# Patient Record
Sex: Female | Born: 1935 | Race: White | Hispanic: No | Marital: Single | Smoking: Never smoker
Health system: Southern US, Community
[De-identification: ages and names within clinical notes are randomized; demographics above are authoritative.]

## PROBLEM LIST (undated history)

## (undated) DIAGNOSIS — E119 Type 2 diabetes mellitus without complications: Secondary | ICD-10-CM

## (undated) DIAGNOSIS — I10 Essential (primary) hypertension: Secondary | ICD-10-CM

## (undated) DIAGNOSIS — E669 Obesity, unspecified: Secondary | ICD-10-CM

## (undated) DIAGNOSIS — I2510 Atherosclerotic heart disease of native coronary artery without angina pectoris: Secondary | ICD-10-CM

## (undated) HISTORY — DX: Obesity, unspecified: E66.9

## (undated) HISTORY — PX: CHOLECYSTECTOMY: SHX55

## (undated) HISTORY — DX: Atherosclerotic heart disease of native coronary artery without angina pectoris: I25.10

## (undated) HISTORY — DX: Type 2 diabetes mellitus without complications: E11.9

## (undated) HISTORY — DX: Essential (primary) hypertension: I10

---

## 2002-06-05 ENCOUNTER — Encounter: Payer: Self-pay | Admitting: Emergency Medicine

## 2002-06-05 ENCOUNTER — Emergency Department (HOSPITAL_COMMUNITY): Admission: EM | Admit: 2002-06-05 | Discharge: 2002-06-05 | Payer: Self-pay | Admitting: Emergency Medicine

## 2005-01-18 ENCOUNTER — Ambulatory Visit: Payer: Self-pay | Admitting: Orthopedic Surgery

## 2009-12-23 ENCOUNTER — Ambulatory Visit: Payer: Self-pay | Admitting: Cardiovascular Disease

## 2009-12-23 ENCOUNTER — Encounter: Payer: Self-pay | Admitting: Cardiology

## 2009-12-23 ENCOUNTER — Observation Stay (HOSPITAL_COMMUNITY): Admission: RE | Admit: 2009-12-23 | Discharge: 2009-12-25 | Payer: Self-pay | Admitting: Cardiovascular Disease

## 2009-12-24 ENCOUNTER — Encounter (INDEPENDENT_AMBULATORY_CARE_PROVIDER_SITE_OTHER): Payer: Self-pay | Admitting: Internal Medicine

## 2009-12-24 ENCOUNTER — Encounter: Payer: Self-pay | Admitting: Cardiology

## 2009-12-24 ENCOUNTER — Encounter: Payer: Self-pay | Admitting: Cardiovascular Disease

## 2009-12-24 ENCOUNTER — Encounter (INDEPENDENT_AMBULATORY_CARE_PROVIDER_SITE_OTHER): Payer: Self-pay | Admitting: Physician Assistant

## 2010-01-01 ENCOUNTER — Encounter: Payer: Self-pay | Admitting: Cardiology

## 2010-01-01 DIAGNOSIS — R002 Palpitations: Secondary | ICD-10-CM

## 2010-01-01 DIAGNOSIS — R072 Precordial pain: Secondary | ICD-10-CM

## 2010-01-06 ENCOUNTER — Ambulatory Visit: Payer: Self-pay | Admitting: Cardiology

## 2010-01-15 ENCOUNTER — Ambulatory Visit: Payer: Self-pay | Admitting: Cardiology

## 2010-01-15 DIAGNOSIS — I10 Essential (primary) hypertension: Secondary | ICD-10-CM | POA: Insufficient documentation

## 2010-01-16 ENCOUNTER — Encounter (INDEPENDENT_AMBULATORY_CARE_PROVIDER_SITE_OTHER): Payer: Self-pay | Admitting: *Deleted

## 2010-03-02 ENCOUNTER — Encounter: Payer: Self-pay | Admitting: Cardiology

## 2010-12-22 NOTE — Letter (Signed)
Summary: Appointment -missed   March 02, 2010 MRN: [id number]      [facility] [address], [place]  [address]     Dear Ms. Knapik,  Our records indicate you missed your appointment on March 02, 2010                        with Dr.  Andee Lineman.   It is very important that we reach you to reschedule this appointment. We look forward to participating in your health care needs.   Please contact us at the number listed above at your earliest convenience to reschedule this appointment.   Sincerely,    Glass blower/designer

## 2010-12-22 NOTE — Letter (Signed)
Summary: Engineer, materials at [facility]  [address]   [place], [place] [address]   Phone: [phone]  Fax: [phone]        January 16, 2010 MRN: [id number]   [facility] [address] [place]  [address]   Dear Ms. Febles,  Your test ordered by Selena Batten has been reviewed by your physician (or physician assistant) and was found to be normal or stable. Your physician (or physician assistant) felt no changes were needed at this time.  ____ Echocardiogram  __X__ Cardiac Stress Test  ____ Lab Work  ____ Peripheral vascular study of arms, legs or neck  ____ CT scan or X-ray  ____ Lung or Breathing test  ____ Other:   Thank you.   Hoover Brunette, LPN    Duane Boston, M.D., F.A.C.C. Thressa Sheller, M.D., F.A.C.C. Oneal Grout, M.D., F.A.C.C. Cheree Ditto, M.D., F.A.C.C. Daiva Nakayama, M.D., F.A.C.C. Kenney Houseman, M.D., F.A.C.C. Jeanne Ivan, PA-C

## 2010-12-22 NOTE — Miscellaneous (Signed)
Summary: Orders Update  Clinical Lists Changes  Problems: Added new problem of PALPITATIONS (ICD-785.1) Added new problem of PRECORDIAL PAIN (ICD-786.51) Orders: Added new Referral order of Nuclear Med (Nuc Med) - Signed

## 2010-12-22 NOTE — Assessment & Plan Note (Signed)
Summary: NPH D/C [facility] 2/2/ CHEST PAINS   Visit Type:  Follow-up Primary Provider:  Sherryll Burger  CC:  new patient hospital follow up.  History of Present Illness: the patient is a 77 year old female with a history of atypical chest pain recently hospitalized and ruled out for myocardial infarction.  The patient has diet-controlled diabetes mellitus and newly diagnosed essential hypertension.  She also has morbid obesity.  The patient was also ruled out for acute pulmonary was him with a CT scan which was negative.  An echocardiogram showed normal LV function.  The patient was referred for an outpatient stress test.the patient's stress test was entirely within normal limits.  She did have a transient left bundle branch block. f on initial presentation the patient reported that she had "weird feeling in the chest off work.nitroglycerin seems to help.  She also felt palpitations.  She said very little problems since she has gone home.  She has occasional discomfort in her chest which occurs in the evening but she is able to go to sleep without any difficulty.  The patient denies any chest pain on exertion orthopnea PND palpitations or syncope.  Her blood pressure however is poorly controlled.  She also appears to have symptoms of reflux.  Preventive Screening-Counseling & Management  Alcohol-Tobacco     Smoking Status: never   Current Medications (verified): 1)  Aspir-Low 81 Mg Tbec (Aspirin) .... Take 1 Tablet By Mouth Once A Day 2)  Amlodipine Besylate 5 Mg Tabs (Amlodipine Besylate) .... Take 1 Tablet By Mouth Once A Day 3)  Metoprolol Tartrate 25 Mg Tabs (Metoprolol Tartrate) .... Take 1/2 Tablet By Mouth Two Times A Day 4)  Nitrostat 0.4 Mg Subl (Nitroglycerin) .... Use As Directed 5)  Multivitamins  Tabs (Multiple Vitamin) .... Take 1 Tablet By Mouth Once A Day 6)  Ascorbic Acid 500 Mg Tabs (Ascorbic Acid) .... Take 1 Tablet By Mouth Once A Day 7)  Omeprazole Magnesium 20.6 (20 Base) Mg Cpdr  (Omeprazole Magnesium) .... Take 1 Tablet By Mouth Once A Day  Allergies: 1)  ! * Tuna Fish  Comments:  Nurse/Medical Assistant: The patient's medications and allergies were reviewed with the patient and were updated in the Medication and Allergy Lists. Bottles reviewed.  Past History:  Past Medical History: Last updated: 01/15/2010 Diabetes.  Family history of coronary artery disease.  Obesity with a body mass index of 35.4.   Past Surgical History: Last updated: 01/15/2010 Cholecystectomy  Family History: Last updated: 01/15/2010 Sgnificant for Coronary disease and Diabetes in her mother  Social History: Last updated: 01/15/2010 Retired  Married  Alcohol Use - no Drug Use - no  Risk Factors: Smoking Status: never (01/15/2010)  Social History: Smoking Status:  never  Review of Systems       The patient complains of palpitations.  The patient denies fatigue, malaise, fever, weight gain/loss, vision loss, decreased hearing, hoarseness, chest pain, shortness of breath, prolonged cough, wheezing, sleep apnea, coughing up blood, abdominal pain, blood in stool, nausea, vomiting, diarrhea, heartburn, incontinence, blood in urine, muscle weakness, joint pain, leg swelling, rash, skin lesions, headache, fainting, dizziness, depression, anxiety, enlarged lymph nodes, easy bruising or bleeding, and environmental allergies.    Vital Signs:  Patient profile:   77 year old female Height:      63 inches Weight:      209 pounds BMI:     37.16 Pulse rate:   59 / minute BP sitting:   151 / 78  (left  arm) Cuff size:   large  Vitals Entered By: Carlye Grippe (January 15, 2010 10:41 AM)  Nutrition Counseling: Patient's BMI is greater than 25 and therefore counseled on weight management options. CC: new patient hospital follow up   Physical Exam  Additional Exam:  General: Well-developed, well-nourished in no distress head: Normocephalic and atraumatic eyes PERRLA/EOMI  intact, conjunctiva and lids normal nose: No deformity or lesions mouth normal dentition, normal posterior pharynx neck: Supple, no JVD.  No masses, thyromegaly or abnormal cervical nodes lungs: Normal breath sounds bilaterally without wheezing.  Normal percussion heart: regular rate and rhythm with normal S1 and S2, no S3 or S4.  PMI is normal.  No pathological murmurs abdomen: Normal bowel sounds, abdomen is soft and nontender without masses, organomegaly or hernias noted.  No hepatosplenomegaly musculoskeletal: Back normal, normal gait muscle strength and tone normal pulsus: Pulse is normal in all 4 extremities Extremities: No peripheral pitting edema neurologic: Alert and oriented x 3 skin: Intact without lesions or rashes cervical nodes: No significant adenopathy psychologic: Normal affect    Impression & Recommendations:  Problem # 1:  PRECORDIAL PAIN (ICD-786.51) the patient reported atypical chest pain.  She stress test done which showed no ischemia.  Will continue to treat her medically.I suspect the patient may have esophageal spasm and therefore we switched her lisinopril to amlodipine and also have given her omeprazole as an antireflux measure. Her updated medication list for this problem includes:    Aspir-low 81 Mg Tbec (Aspirin) .Marland Kitchen... Take 1 tablet by mouth once a day    Amlodipine Besylate 5 Mg Tabs (Amlodipine besylate) .Marland Kitchen... Take 1 tablet by mouth once a day    Metoprolol Tartrate 25 Mg Tabs (Metoprolol tartrate) .Marland Kitchen... Take 1/2 tablet by mouth two times a day    Nitrostat 0.4 Mg Subl (Nitroglycerin) ..... Use as directed  Problem # 2:  PALPITATIONS (ICD-785.1) no clear need for a cardiac monitor at the present time. Her updated medication list for this problem includes:    Aspir-low 81 Mg Tbec (Aspirin) .Marland Kitchen... Take 1 tablet by mouth once a day    Amlodipine Besylate 5 Mg Tabs (Amlodipine besylate) .Marland Kitchen... Take 1 tablet by mouth once a day    Metoprolol Tartrate 25 Mg  Tabs (Metoprolol tartrate) .Marland Kitchen... Take 1/2 tablet by mouth two times a day    Nitrostat 0.4 Mg Subl (Nitroglycerin) ..... Use as directed  Problem # 3:  ESSENTIAL HYPERTENSION, BENIGN (ICD-401.1) the patient is a very low dose of lisinopril.  She has a cough and we will change this to amlodipine at 5 minutes p.o. daily. Her updated medication list for this problem includes:    Aspir-low 81 Mg Tbec (Aspirin) .Marland Kitchen... Take 1 tablet by mouth once a day    Amlodipine Besylate 5 Mg Tabs (Amlodipine besylate) .Marland Kitchen... Take 1 tablet by mouth once a day    Metoprolol Tartrate 25 Mg Tabs (Metoprolol tartrate) .Marland Kitchen... Take 1/2 tablet by mouth two times a day  Patient Instructions: 1)  Stop Lisinopril 2)  Amlodipine 5mg  daily 3)  Omeprazole 20mg  daily 4)  Follow up in  6 weeks. Prescriptions: OMEPRAZOLE MAGNESIUM 20.6 (20 BASE) MG CPDR (OMEPRAZOLE MAGNESIUM) Take 1 tablet by mouth once a day  #30 x 1   Entered by:   Hoover Brunette, LPN   Authorized by:   Lewayne Bunting, MD, [facility]   Signed by:   Hoover Brunette, LPN on 45/40/9811   Method used:   Electronically to  [facility] (retail)       [address]       [place], [place]  [address]       Ph: [phone]       Fax: [phone]   RxID:   [id number] AMLODIPINE BESYLATE 5 MG TABS (AMLODIPINE BESYLATE) Take 1 tablet by mouth once a day  #30 x 6   Entered by:   Hoover Brunette, LPN   Authorized by:   Lewayne Bunting, MD, [facility]   Signed by:   Hoover Brunette, LPN on 84/13/2440   Method used:   Electronically to        [facility] (retail)       [address] [place], [place]  [address]       Ph: [phone]       Fax: [phone]   RxID:   [id number]   Handout requested.

## 2010-12-22 NOTE — Consult Note (Signed)
Summary: [facility]   [facility]   Imported By: Zachary George 01/01/2010 17:28:17  _____________________________________________________________________  External Attachment:    Type:   Image     Comment:   External Document

## 2010-12-25 NOTE — Consult Note (Signed)
[facility]   Imported By: Roderic Ovens 01/12/2010 14:23:14  _____________________________________________________________________  External Attachment:    Type:   Image     Comment:   External Document

## 2011-02-11 LAB — DIFFERENTIAL
Basophils Relative: 0 % (ref 0–1)
Lymphocytes Relative: 21 % (ref 12–46)
Monocytes Relative: 4 % (ref 3–12)
Neutro Abs: 5.6 10*3/uL (ref 1.7–7.7)
Neutrophils Relative %: 74 % (ref 43–77)

## 2011-02-11 LAB — CARDIAC PANEL(CRET KIN+CKTOT+MB+TROPI)
CK, MB: 2.6 ng/mL (ref 0.3–4.0)
Relative Index: INVALID (ref 0.0–2.5)
Total CK: 75 U/L (ref 7–177)
Total CK: 89 U/L (ref 7–177)

## 2011-02-11 LAB — LIPID PANEL
HDL: 49 mg/dL (ref >39)
LDL Cholesterol: 93 mg/dL (ref 0–99)
Total CHOL/HDL Ratio: 3.5 RATIO
VLDL: 29 mg/dL (ref 0–40)

## 2011-02-11 LAB — CK TOTAL AND CKMB (NOT AT ARMC)
CK, MB: 5.3 ng/mL — ABNORMAL HIGH (ref 0.3–4.0)
Relative Index: 4.8 — ABNORMAL HIGH (ref 0.0–2.5)

## 2011-02-11 LAB — BASIC METABOLIC PANEL
CO2: 27 mEq/L (ref 19–32)
Calcium: 9.4 mg/dL (ref 8.4–10.5)
Creatinine, Ser: 0.54 mg/dL (ref 0.4–1.2)
GFR calc Af Amer: >60 mL/min (ref >60)
GFR calc non Af Amer: >60 mL/min (ref >60)

## 2011-02-11 LAB — CBC
MCHC: 33.8 g/dL (ref 30.0–36.0)
RBC: 4.19 MIL/uL (ref 3.87–5.11)
WBC: 7.6 10*3/uL (ref 4.0–10.5)

## 2011-02-11 LAB — HEMOGLOBIN A1C: Hgb A1c MFr Bld: 6.5 % — ABNORMAL HIGH (ref 4.6–6.1)

## 2011-02-11 LAB — GLUCOSE, CAPILLARY
Glucose-Capillary: 101 mg/dL — ABNORMAL HIGH (ref 70–99)
Glucose-Capillary: 136 mg/dL — ABNORMAL HIGH (ref 70–99)

## 2011-02-11 LAB — BRAIN NATRIURETIC PEPTIDE: Pro B Natriuretic peptide (BNP): 79 pg/mL (ref 0.0–100.0)

## 2012-05-23 ENCOUNTER — Encounter: Payer: Self-pay | Admitting: Cardiology

## 2012-11-22 HISTORY — PX: CARDIAC CATHETERIZATION: SHX172

## 2015-10-06 ENCOUNTER — Other Ambulatory Visit (HOSPITAL_COMMUNITY): Payer: Self-pay | Admitting: Endocrinology

## 2015-10-06 DIAGNOSIS — E0590 Thyrotoxicosis, unspecified without thyrotoxic crisis or storm: Secondary | ICD-10-CM

## 2015-10-06 DIAGNOSIS — E0511 Thyrotoxicosis with toxic single thyroid nodule with thyrotoxic crisis or storm: Secondary | ICD-10-CM

## 2015-10-08 ENCOUNTER — Encounter (HOSPITAL_COMMUNITY): Payer: Medicare Other

## 2015-10-08 ENCOUNTER — Encounter (HOSPITAL_COMMUNITY)
Admission: RE | Admit: 2015-10-08 | Discharge: 2015-10-08 | Disposition: A | Discharge status: Home / Self Care | Payer: Medicare Other | Source: Ambulatory Visit | Attending: Endocrinology | Admitting: Endocrinology

## 2015-10-08 ENCOUNTER — Encounter (HOSPITAL_COMMUNITY): Payer: Self-pay

## 2015-10-08 DIAGNOSIS — E0511 Thyrotoxicosis with toxic single thyroid nodule with thyrotoxic crisis or storm: Secondary | ICD-10-CM | POA: Insufficient documentation

## 2015-10-08 MED ORDER — SODIUM IODIDE I 131 CAPSULE
25.0000 | Freq: Once | INTRAVENOUS | Status: AC | PRN
Start: 2015-10-08 — End: 2015-10-08
  Administered 2015-10-08: 25.1 via ORAL

## 2016-01-06 ENCOUNTER — Encounter: Payer: Self-pay | Admitting: *Deleted

## 2016-01-07 ENCOUNTER — Encounter: Payer: Self-pay | Admitting: *Deleted

## 2016-01-07 ENCOUNTER — Encounter: Payer: Self-pay | Admitting: Cardiovascular Disease

## 2016-01-07 ENCOUNTER — Ambulatory Visit (INDEPENDENT_AMBULATORY_CARE_PROVIDER_SITE_OTHER): Payer: Medicare Other | Admitting: Cardiovascular Disease

## 2016-01-07 VITALS — BP 157/68 | HR 63 | Ht 63.0 in | Wt 205.0 lb

## 2016-01-07 DIAGNOSIS — I25118 Atherosclerotic heart disease of native coronary artery with other forms of angina pectoris: Secondary | ICD-10-CM | POA: Diagnosis not present

## 2016-01-07 DIAGNOSIS — R079 Chest pain, unspecified: Secondary | ICD-10-CM | POA: Diagnosis not present

## 2016-01-07 DIAGNOSIS — R0602 Shortness of breath: Secondary | ICD-10-CM

## 2016-01-07 DIAGNOSIS — I10 Essential (primary) hypertension: Secondary | ICD-10-CM

## 2016-01-07 DIAGNOSIS — Z955 Presence of coronary angioplasty implant and graft: Secondary | ICD-10-CM

## 2016-01-07 MED ORDER — VALSARTAN-HYDROCHLOROTHIAZIDE 320-25 MG PO TABS: 1.0000 | ORAL_TABLET | Freq: Every day | ORAL | Status: DC

## 2016-01-07 NOTE — Progress Notes (Signed)
Patient ID: Maria Durham, female   DOB: November 17, 1929, 82 y.o.   MRN: [id number]       CARDIOLOGY CONSULT NOTE  Patient ID: Maria Durham MRN: [id number] DOB/AGE: 31-May-1929 82 y.o.  Admit date: (Not on file) Primary Physician Kirstie Peri, MD  Reason for Consultation: SOB, CAD  HPI: The patient is an 82 year old woman with a history of coronary artery disease with percutaneous coronary intervention with a stent placed to the proximal RCA (3.5 x 24 mm Promus Premier Monorail) for a 95% stenosis on 02/02/13 at [facility] by Dr. Dorena Cookey.   At that time left ventriculography demonstrated mildly reduced left ventricular systolic function, EF 45-50%. She was most recently evaluated by Dr. Carleene Cooper on 06/04/14 [facility] cardiology).  Approximately 2 weeks ago while singing in church choir, she experienced shortness of breath and chest tightness. Her PCP thought acid reflux may be playing a role and started her on Protonix. She has had only mild alleviation of her symptoms. She has been experiencing intermittent shortness of breath and has felt more fatigued as of late. An echocardiogram was performed at her PCPs office on 01/05/16 but this report is not presently unavailable to me. The most recent echocardiogram I find is dated 12/24/09 and at that time left ventricle systolic function was normal, EF 55-60%. Her blood pressure has been elevated and she believes her systolic was 150 at her PCPs office recently.  ECG performed in the office today demonstrates sinus rhythm with first-degree AV block and a left bundle branch block.   Allergies  Allergen Reactions  . Fish Oil Hives  . Oxycontin [Oxycodone Hcl] Other (See Comments)    "made me crazy"    Current Outpatient Prescriptions  Medication Sig Dispense Refill  . aspirin 81 MG tablet Take 81 mg by mouth daily.    . metoprolol tartrate (LOPRESSOR) 25 MG tablet Take 12.5 mg by mouth 2 (two) times daily.    . Multiple Vitamins-Minerals  (MULTIVITAMIN PO) Take by mouth.    . pantoprazole (PROTONIX) 40 MG tablet Take 40 mg by mouth daily.    . valsartan-hydrochlorothiazide (DIOVAN-HCT) 160-12.5 MG tablet Take 1 tablet by mouth daily.    . vitamin C (ASCORBIC ACID) 500 MG tablet Take 500 mg by mouth daily.    Marland Kitchen alendronate (FOSAMAX) 70 MG tablet Take 70 mg by mouth once a week. Reported on 01/07/2016    . nitroGLYCERIN (NITROSTAT) 0.4 MG SL tablet Place 0.4 mg under the tongue every 5 (five) minutes as needed. Reported on 01/07/2016     No current facility-administered medications for this visit.    Past Medical History  Diagnosis Date  . Diabetes mellitus   . Obesity     BMI 35.4  . Coronary artery disease   . Hypertension     Past Surgical History  Procedure Laterality Date  . Cholecystectomy    . Cardiac catheterization  2014    with stent placement     Social History   Social History  . Marital Status: Single    Spouse Name: N/A  . Number of Children: N/A  . Years of Education: N/A   Occupational History  . Not on file.   Social History Main Topics  . Smoking status: Never Smoker   . Smokeless tobacco: Never Used  . Alcohol Use: No  . Drug Use: No  . Sexual Activity: Not on file   Other Topics Concern  . Not on file   Social History Narrative  No family history of premature CAD in 1st degree relatives.  Prior to Admission medications   Medication Sig Start Date End Date Taking? Authorizing Provider  aspirin 81 MG tablet Take 81 mg by mouth daily.   Yes Historical Provider, MD  metoprolol tartrate (LOPRESSOR) 25 MG tablet Take 12.5 mg by mouth 2 (two) times daily.   Yes Historical Provider, MD  Multiple Vitamins-Minerals (MULTIVITAMIN PO) Take by mouth.   Yes Historical Provider, MD  pantoprazole (PROTONIX) 40 MG tablet Take 40 mg by mouth daily.   Yes Historical Provider, MD  valsartan-hydrochlorothiazide (DIOVAN-HCT) 160-12.5 MG tablet Take 1 tablet by mouth daily.   Yes Historical  Provider, MD  vitamin C (ASCORBIC ACID) 500 MG tablet Take 500 mg by mouth daily.   Yes Historical Provider, MD  alendronate (FOSAMAX) 70 MG tablet Take 70 mg by mouth once a week. Reported on 01/07/2016    Historical Provider, MD  nitroGLYCERIN (NITROSTAT) 0.4 MG SL tablet Place 0.4 mg under the tongue every 5 (five) minutes as needed. Reported on 01/07/2016    Historical Provider, MD     Review of systems complete and found to be negative unless listed above in HPI     Physical exam Blood pressure 157/68, pulse 63, height  (1.6 m), weight 205 lb (92.987 kg). General: NAD Neck: No JVD, no thyromegaly or thyroid nodule.  Lungs: Clear to auscultation bilaterally with normal respiratory effort. CV: Nondisplaced PMI. Regular rate and rhythm, normal S1/S2, no S3/S4, no murmur.  Trace peripheral edema.   Abdomen: Soft, nontender, obese.  Skin: Intact without lesions or rashes.  Neurologic: Alert and oriented x 3.  Psych: Normal affect. Extremities: No clubbing or cyanosis.  HEENT: Normal.   ECG: Most recent ECG reviewed.  Labs:   Lab Results  Component Value Date   WBC 7.6 12/23/2009   HGB 12.5 12/23/2009   HCT 37.1 12/23/2009   MCV 88.7 12/23/2009   PLT 214 12/23/2009   No results for input(s): NA, K, CL, CO2, BUN, CREATININE, CALCIUM, PROT, BILITOT, ALKPHOS, ALT, AST, GLUCOSE in the last 168 hours.  Invalid input(s): LABALBU Lab Results  Component Value Date   CKTOTAL 75 12/24/2009   CKMB 2.6 12/24/2009   TROPONINI 0.01        NO INDICATION OF MYOCARDIAL INJURY. 12/24/2009    Lab Results  Component Value Date   CHOL  12/24/2009    171        ATP III CLASSIFICATION:  <200     mg/dL   Desirable  540-981  mg/dL   Borderline High  >=191    mg/dL   High          Lab Results  Component Value Date   HDL 49 12/24/2009   Lab Results  Component Value Date   LDLCALC  12/24/2009    93        Total Cholesterol/HDL:CHD Risk Coronary Heart Disease Risk Table                      Men   Women  1/2 Average Risk   3.4   3.3  Average Risk       5.0   4.4  2 X Average Risk   9.6   7.1  3 X Average Risk  23.4   11.0        Use the calculated Patient Ratio above and the CHD Risk Table to determine the patient's CHD Risk.  ATP III CLASSIFICATION (LDL):  <100     mg/dL   Optimal  098-119  mg/dL   Near or Above                    Optimal  130-159  mg/dL   Borderline  147-829  mg/dL   High  >562     mg/dL   Very High   Lab Results  Component Value Date   TRIG 143 12/24/2009   Lab Results  Component Value Date   CHOLHDL 3.5 12/24/2009   No results found for: LDLDIRECT       Studies: No results found.  ASSESSMENT AND PLAN:  1. Shortness of breath and chest tightness in context of CAD with prior RCA stenting: Will proceed with a Lexiscan Cardiolite stress test to evaluate for ischemic heart disease as the etiology of her symptoms. Will obtain results of echocardiogram performed at PCPs office this week. Continue aspirin and beta blocker. Uncertain why she is not on statin therapy.  2. Essential HTN: Elevated on Diovan-HCTZ 160-12.5 mg. Will increase to 320-25 mg daily and check BMET in one week.   Signed: Prentice Docker, M.D., F.A.C.C.  01/07/2016, 9:42 AM

## 2016-01-07 NOTE — Patient Instructions (Addendum)
Your physician has requested that you have a lexiscan myoview. For further information please visit [email or web address] Please follow instruction sheet, as given. Lab for BMET - due in 1 week - order given today. Office will contact with results via phone or letter.   Increase your Diovan / HCTZ to /25mg  daily - new prescription sent to [facility] today.  You may take 2 tabs (at same time) daily of your /12.5mg  tab till finish current supply.  Continue all other medications.   Follow up in  1 month

## 2016-01-19 ENCOUNTER — Encounter (HOSPITAL_COMMUNITY)
Admission: RE | Admit: 2016-01-19 | Discharge: 2016-01-19 | Disposition: A | Discharge status: Home / Self Care | Payer: Medicare Other | Source: Ambulatory Visit | Attending: Cardiovascular Disease | Admitting: Cardiovascular Disease

## 2016-01-19 ENCOUNTER — Encounter: Payer: Self-pay | Admitting: *Deleted

## 2016-01-19 ENCOUNTER — Inpatient Hospital Stay (HOSPITAL_COMMUNITY): Admission: RE | Admit: 2016-01-19 | Payer: Medicare Other | Source: Ambulatory Visit

## 2016-01-19 ENCOUNTER — Encounter (HOSPITAL_COMMUNITY): Payer: Self-pay

## 2016-01-19 DIAGNOSIS — I25118 Atherosclerotic heart disease of native coronary artery with other forms of angina pectoris: Secondary | ICD-10-CM | POA: Diagnosis present

## 2016-01-19 DIAGNOSIS — R0602 Shortness of breath: Secondary | ICD-10-CM | POA: Insufficient documentation

## 2016-01-19 DIAGNOSIS — R079 Chest pain, unspecified: Secondary | ICD-10-CM | POA: Diagnosis not present

## 2016-01-19 DIAGNOSIS — I447 Left bundle-branch block, unspecified: Secondary | ICD-10-CM | POA: Diagnosis not present

## 2016-01-19 LAB — NM MYOCAR MULTI W/SPECT W/WALL MOTION / EF
CHL CUP NUCLEAR SDS: 3
CHL CUP NUCLEAR SRS: 5
CHL CUP NUCLEAR SSS: 8
LHR: 0.48
LV sys vol: 24 mL
LVDIAVOL: 77 mL
NUC STRESS TID: 0.93
Peak HR: 81 {beats}/min
Rest HR: 58 {beats}/min

## 2016-01-19 MED ORDER — SODIUM CHLORIDE 0.9% FLUSH
INTRAVENOUS | Status: AC
Start: 2016-01-19 — End: 2016-01-19
  Administered 2016-01-19: 10 mL via INTRAVENOUS
  Filled 2016-01-19: qty 10

## 2016-01-19 MED ORDER — TECHNETIUM TC 99M SESTAMIBI GENERIC - CARDIOLITE
10.0000 | Freq: Once | INTRAVENOUS | Status: AC | PRN
  Administered 2016-01-19: 10 via INTRAVENOUS

## 2016-01-19 MED ORDER — TECHNETIUM TC 99M SESTAMIBI - CARDIOLITE
30.0000 | Freq: Once | INTRAVENOUS | Status: AC | PRN
  Administered 2016-01-19: 11:00:00 30 via INTRAVENOUS

## 2016-01-19 MED ORDER — REGADENOSON 0.4 MG/5ML IV SOLN
INTRAVENOUS | Status: AC
  Administered 2016-01-19: 0.4 mg via INTRAVENOUS
  Filled 2016-01-19: qty 5

## 2016-01-27 ENCOUNTER — Ambulatory Visit: Payer: Medicare Other | Admitting: Cardiovascular Disease

## 2016-02-12 ENCOUNTER — Ambulatory Visit (INDEPENDENT_AMBULATORY_CARE_PROVIDER_SITE_OTHER): Payer: Medicare Other | Admitting: Cardiovascular Disease

## 2016-02-12 ENCOUNTER — Encounter: Payer: Self-pay | Admitting: Cardiovascular Disease

## 2016-02-12 VITALS — BP 107/70 | HR 67 | Ht 63.0 in | Wt 204.0 lb

## 2016-02-12 DIAGNOSIS — I10 Essential (primary) hypertension: Secondary | ICD-10-CM

## 2016-02-12 DIAGNOSIS — R079 Chest pain, unspecified: Secondary | ICD-10-CM | POA: Diagnosis not present

## 2016-02-12 DIAGNOSIS — Z955 Presence of coronary angioplasty implant and graft: Secondary | ICD-10-CM

## 2016-02-12 DIAGNOSIS — R0602 Shortness of breath: Secondary | ICD-10-CM | POA: Diagnosis not present

## 2016-02-12 DIAGNOSIS — I25118 Atherosclerotic heart disease of native coronary artery with other forms of angina pectoris: Secondary | ICD-10-CM

## 2016-02-12 NOTE — Patient Instructions (Signed)
Continue all current medications. Your physician wants you to follow up in:  1 year.  You will receive a reminder letter in the mail one-two months in advance.  If you don't receive a letter, please call our office to schedule the follow up appointment   

## 2016-02-12 NOTE — Progress Notes (Signed)
Patient ID: Maria Durham, female   DOB: June 06, 1929, 82 y.o.   MRN: [id number]      SUBJECTIVE: The patient returns for follow-up after undergoing cardiovascular testing performed for the evaluation of chest pain. Nuclear stress testing on 01/19/16 demonstrated a very mild region of ischemia of mild intensity in the mid lateral wall. It was deemed a low risk study, calculated LVEF 69%.  Echocardiogram on 01/05/16 showed normal left ventricular systolic function, EF 60-65%, grade 1 diastolic dysfunction, and mild mitral and tricuspid regurgitation.  In summary, she has a history of coronary artery disease with percutaneous coronary intervention with a stent placed to the proximal RCA (3.5 x 24 mm Promus Premier Monorail) for a 95% stenosis [facility] 02[facility]02[facility] by Dr. Dorena CookeyKutcher.   She is feeling well and has had no recurrences of chest pain. She occasionally has dizziness but denies syncope.   Review of Systems: As per "subjective", otherwise negative.  Allergies  Allergen Reactions  . Fish Oil Hives  . Oxycontin [Oxycodone Hcl] Other (See Comments)    "made me crazy"    Current Outpatient Prescriptions  Medication Sig Dispense Refill  . alendronate (FOSAMAX) 70 MG tablet Take 70 mg by mouth once a week. Reported on 01/07/2016    . aspirin 81 MG tablet Take 81 mg by mouth daily.    . metoprolol tartrate (LOPRESSOR) 25 MG tablet Take 12.5 mg by mouth 2 (two) times daily.    . Multiple Vitamins-Minerals (MULTIVITAMIN PO) Take by mouth.    . nitroGLYCERIN (NITROSTAT) 0.4 MG SL tablet Place 0.4 mg under the tongue every 5 (five) minutes as needed. Reported on 01/07/2016    . pantoprazole (PROTONIX) 40 MG tablet Take 40 mg by mouth daily.    . valsartan-hydrochlorothiazide (DIOVAN HCT) 320-25 MG tablet Take 1 tablet by mouth daily. 30 tablet 6  . vitamin C (ASCORBIC ACID) 500 MG tablet Take 500 mg by mouth daily.     No current facility-administered medications for this visit.    Past  Medical History  Diagnosis Date  . Diabetes mellitus   . Obesity     BMI 35.4  . Coronary artery disease   . Hypertension     Past Surgical History  Procedure Laterality Date  . Cholecystectomy    . Cardiac catheterization  2014    with stent placement     Social History   Social History  . Marital Status: Single    Spouse Name: N/A  . Number of Children: N/A  . Years of Education: N/A   Occupational History  . Not on file.   Social History Main Topics  . Smoking status: Never Smoker   . Smokeless tobacco: Never Used  . Alcohol Use: No  . Drug Use: No  . Sexual Activity: Not on file   Other Topics Concern  . Not on file   Social History Narrative     Filed Vitals:   02/12/16 1413  BP: 107/70  Pulse: 67  Height: 5\' 3"  (1.6 m)  Weight: 204 lb (92.534 kg)    PHYSICAL EXAM General: NAD Neck: No JVD, no thyromegaly or thyroid nodule.  Lungs: Clear to auscultation bilaterally with normal respiratory effort. CV: Nondisplaced PMI. Regular rate and rhythm, normal S1/S2, no S3/S4, no murmur. Trace peripheral edema.  Abdomen: Soft, nontender, obese.  Skin: Intact without lesions or rashes.  Neurologic: Alert and oriented x 3.  Psych: Normal affect. Extremities: No clubbing or cyanosis.  HEENT: Normal.  ECG: Most recent ECG reviewed.      ASSESSMENT AND PLAN: 1. Shortness of breath and chest tightness in context of CAD with prior RCA stenting: Symptomatically stable. Lexiscan Cardiolite stress test results reviewed above. Continue aspirin and beta blocker. Uncertain why she is not on statin therapy.  2. Essential HTN: Now controlled with increase of Diovan-HCT to 320-25 mg daily. No changes.  Dispo: fu 1 year.  Prentice Docker, M.D., F.A.C.C.

## 2016-06-09 ENCOUNTER — Ambulatory Visit (INDEPENDENT_AMBULATORY_CARE_PROVIDER_SITE_OTHER): Payer: Medicare Other | Admitting: Orthopedic Surgery

## 2016-06-09 ENCOUNTER — Ambulatory Visit (HOSPITAL_COMMUNITY)
Admission: RE | Admit: 2016-06-09 | Discharge: 2016-06-09 | Disposition: A | Discharge status: Home / Self Care | Payer: Medicare Other | Source: Ambulatory Visit | Attending: Orthopedic Surgery | Admitting: Orthopedic Surgery

## 2016-06-09 ENCOUNTER — Encounter: Payer: Self-pay | Admitting: Orthopedic Surgery

## 2016-06-09 VITALS — BP 147/61 | Ht 63.0 in | Wt 205.0 lb

## 2016-06-09 DIAGNOSIS — M25512 Pain in left shoulder: Secondary | ICD-10-CM

## 2016-06-09 DIAGNOSIS — M129 Arthropathy, unspecified: Secondary | ICD-10-CM

## 2016-06-09 DIAGNOSIS — M19012 Primary osteoarthritis, left shoulder: Secondary | ICD-10-CM | POA: Diagnosis not present

## 2016-06-09 DIAGNOSIS — M19019 Primary osteoarthritis, unspecified shoulder: Secondary | ICD-10-CM

## 2016-06-09 MED ORDER — DICLOFENAC POTASSIUM 50 MG PO TABS: 50.0000 mg | ORAL_TABLET | Freq: Every day | ORAL | Status: DC

## 2016-06-09 NOTE — Progress Notes (Signed)
Procedure note the subacromial injection shoulder left   Verbal consent was obtained to inject the  Left   Shoulder  Timeout was completed to confirm the injection site is a subacromial space of the  left  shoulder  Medication used Depo-Medrol 40 mg and lidocaine 1% 3 cc  Anesthesia was provided by ethyl chloride  The injection was performed in the left  posterior subacromial space. After pinning the skin with alcohol and anesthetized the skin with ethyl chloride the subacromial space was injected using a 20-gauge needle. There were no complications  Sterile dressing was applied.          

## 2016-06-09 NOTE — Progress Notes (Signed)
Patient ID: Maria Hagemanrene S Peregoy, female   DOB: 13-Jan-1929, 82 y.o.   MRN: [id number]  Chief Complaint  Patient presents with  . Shoulder Pain    left shoulder pain    HPI Maria Durham is a 82 y.o. female.  Right-handed HPI 82 years old right hand dominant presents for evaluation of pain and stiffness in the left shoulder with loss of motion  Location of pain left shoulder anterior joint line. Quality dull ache. Severity moderate to severe depending on position of the arm. Duration increasing over the last 2 years  Timing occurs at night when she's resting on the left side or she will bring the arm too far across her body and also occurs with attempts at motion and moving the arm or lifting something. It is worsening. She notes take significant loss in her range of motion.   Review of Systems Review of Systems Normal neuro  Denies fever   Past Medical History  Diagnosis Date  . Diabetes mellitus   . Obesity     BMI 35.4  . Coronary artery disease   . Hypertension     Past Surgical History  Procedure Laterality Date  . Cholecystectomy    . Cardiac catheterization  2014    with stent placement       Family History  Problem Relation Age of Onset  . Coronary artery disease    . Diabetes Mother    The patient was quizzed about their family history and reported no history of bleeding problems or anesthesia problems in their family  Social History Social History  Substance Use Topics  . Smoking status: Never Smoker   . Smokeless tobacco: Never Used  . Alcohol Use: No    Allergies  Allergen Reactions  . Fish Oil Hives  . Oxycontin [Oxycodone Hcl] Other (See Comments)    "made me crazy"    Current Outpatient Prescriptions  Medication Sig Dispense Refill  . alendronate (FOSAMAX) 70 MG tablet Take 70 mg by mouth once a week. Reported on 01/07/2016    . aspirin 81 MG tablet Take 81 mg by mouth daily.    Marland Kitchen. levothyroxine (SYNTHROID, LEVOTHROID) 25 MCG tablet Take 25 mcg  by mouth daily before breakfast.    . metoprolol tartrate (LOPRESSOR) 25 MG tablet Take 12.5 mg by mouth 2 (two) times daily.    . Multiple Vitamins-Minerals (MULTIVITAMIN PO) Take by mouth.    . nitroGLYCERIN (NITROSTAT) 0.4 MG SL tablet Place 0.4 mg under the tongue every 5 (five) minutes as needed. Reported on 01/07/2016    . pantoprazole (PROTONIX) 40 MG tablet Take 40 mg by mouth daily.    . valsartan-hydrochlorothiazide (DIOVAN HCT) 320-25 MG tablet Take 1 tablet by mouth daily. 30 tablet 6  . vitamin C (ASCORBIC ACID) 500 MG tablet Take 500 mg by mouth daily.    . diclofenac (CATAFLAM) 50 MG tablet Take 1 tablet (50 mg total) by mouth daily. 30 tablet 3   No current facility-administered medications for this visit.       Physical Exam Blood pressure 147/61, height 5\' 3"  (1.6 m), weight 205 lb (92.987 kg). Physical Exam The patient is well developed well nourished and well groomed.  Orientation to person place and time is normal  Mood is pleasant.  Ambulatory status Unremarkable normal heel to toe gait Cervical spine exam is as follows no tenderness normal range of motion supple Ortho Exam Left shoulder  Examination: Inspection reveals tenderness around the anterior joint line.  The patient has decreased range of motion with limited external rotation and limited flexion less than 90 and grade 5 motor strength in the's subscapularis but 3 out of 5 in the supraspinatus and 4 out of 5 in the external rotators. Stability inferior subluxation test normal  Neurovascular examination is intact and the lymph nodes in the axilla and supraclavicular regions are normal   The opposite shoulder right shoulder exhibits normal range of motion stability and strength neurovascular exam is intact, lymph nodes are negative and there is no swelling or tenderness   Data Reviewed IMAGING: AP/LAT left Shoulder: I have read and interpret the x-ray as follows:   glenohumeral arthritis   Assessment     Glenohumeral arthritis left shoulder cannot rule out rotator cuff tear left shoulder  Pain left shoulder   Plan    NSAIDS start diclofenac  INJECTION  F/U 3 months    Maria Canada, MD 06/09/2016 3:56 PM

## 2016-08-02 ENCOUNTER — Other Ambulatory Visit: Payer: Self-pay | Admitting: Cardiovascular Disease

## 2016-09-08 ENCOUNTER — Ambulatory Visit: Payer: Medicare Other | Admitting: Orthopedic Surgery

## 2016-09-13 ENCOUNTER — Ambulatory Visit (INDEPENDENT_AMBULATORY_CARE_PROVIDER_SITE_OTHER): Payer: Medicare Other | Admitting: Orthopedic Surgery

## 2016-09-13 ENCOUNTER — Other Ambulatory Visit: Payer: Self-pay | Admitting: *Deleted

## 2016-09-13 DIAGNOSIS — M19019 Primary osteoarthritis, unspecified shoulder: Secondary | ICD-10-CM

## 2016-09-13 DIAGNOSIS — M25512 Pain in left shoulder: Secondary | ICD-10-CM

## 2016-09-13 DIAGNOSIS — G8929 Other chronic pain: Secondary | ICD-10-CM

## 2016-09-13 MED ORDER — DICLOFENAC POTASSIUM 50 MG PO TABS: 50.0000 mg | ORAL_TABLET | Freq: Every day | ORAL | 3 refills | Status: DC

## 2016-09-13 NOTE — Progress Notes (Signed)
Patient ID: Maria Durham, female   DOB: September 13, 1929, 82 y.o.   MRN: [id number]  Chief Complaint  Patient presents with  . Follow-up    Recheck left shoulder. Injection on last visit.    HPI Maria Durham is a 82 y.o. female.   HPI  82 year old female with osteoarthritis of the left shoulder did well with injections diclofenac. Her pain has improved her function has improved as well.  Review of Systems Review of Systems Related to the shoulder mild stiffness  Physical Exam There were no vitals taken for this visit.   Physical Exam  Exam shows a well-developed well-nourished female grooming and hygiene are intact she is oriented 3 mood and affect are pleasant she walks without support inspection shows no tenderness around the shoulder her range of motion is a functional plain shoulder is stable she has adequate strength in her rotator cuff  Impression osteoarthritis left shoulder  Continue diclofenac  Follow-up as needed

## 2016-09-13 NOTE — Patient Instructions (Signed)
caltrate + D

## 2016-11-13 IMAGING — NM NM MYOCAR MULTI W/SPECT W/WALL MOTION & EF
2 series · 12 of 12 positions shown · non-contrast
Comparison: none

[Series 1: rest · 8.28mm/px · 6 of 64 frames shown]
[frame 6/64]
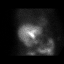
[frame 16/64]
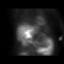
[frame 27/64]
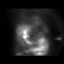
[frame 38/64]
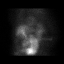
[frame 48/64]
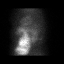
[frame 59/64]
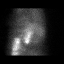

[Series 2: stress gated · 8.28mm/px · 6 of 64 frames shown]
[frame 6/64]
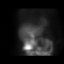
[frame 16/64]
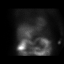
[frame 27/64]
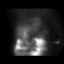
[frame 38/64]
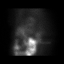
[frame 48/64]
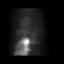
[frame 59/64]
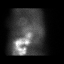

[12 of 12 positions shown; findings below may reference images not displayed]

Canned report from images found in remote index.

Refer to host system for actual result text.

## 2017-02-02 ENCOUNTER — Other Ambulatory Visit: Payer: Self-pay | Admitting: Orthopedic Surgery

## 2017-03-09 ENCOUNTER — Other Ambulatory Visit: Payer: Self-pay | Admitting: Orthopedic Surgery

## 2017-04-04 ENCOUNTER — Other Ambulatory Visit: Payer: Self-pay | Admitting: Cardiovascular Disease

## 2017-04-07 ENCOUNTER — Other Ambulatory Visit: Payer: Self-pay | Admitting: Orthopedic Surgery

## 2017-05-06 ENCOUNTER — Other Ambulatory Visit: Payer: Self-pay | Admitting: Orthopedic Surgery

## 2017-06-13 ENCOUNTER — Other Ambulatory Visit: Payer: Self-pay | Admitting: Orthopedic Surgery

## 2017-06-13 ENCOUNTER — Other Ambulatory Visit: Payer: Self-pay | Admitting: Cardiovascular Disease

## 2017-06-15 ENCOUNTER — Other Ambulatory Visit: Payer: Self-pay | Admitting: Cardiovascular Disease

## 2017-06-15 ENCOUNTER — Telehealth: Payer: Self-pay | Admitting: *Deleted

## 2017-06-15 NOTE — Telephone Encounter (Signed)
Per Lorin PicketScott at [facility] Diovan that pt is currently taking is not part of recall - refill sent - pt aware

## 2017-07-25 ENCOUNTER — Other Ambulatory Visit: Payer: Self-pay | Admitting: Cardiovascular Disease

## 2017-07-25 ENCOUNTER — Telehealth: Payer: Self-pay | Admitting: Orthopedic Surgery

## 2017-07-26 NOTE — Telephone Encounter (Signed)
Routing to Dr. Keeling 

## 2017-09-08 ENCOUNTER — Other Ambulatory Visit: Payer: Self-pay | Admitting: Orthopedic Surgery

## 2017-10-10 ENCOUNTER — Other Ambulatory Visit: Payer: Self-pay | Admitting: Orthopedic Surgery

## 2017-11-10 ENCOUNTER — Other Ambulatory Visit: Payer: Self-pay | Admitting: Orthopedic Surgery

## 2017-11-10 ENCOUNTER — Telehealth: Payer: Self-pay | Admitting: Orthopaedic Surgery

## 2017-11-10 MED ORDER — DICLOFENAC POTASSIUM 50 MG PO TABS: 50.0000 mg | ORAL_TABLET | Freq: Every day | ORAL | 5 refills | Status: DC

## 2017-11-10 NOTE — Telephone Encounter (Signed)
Pended rx  Ok to refill?

## 2017-11-10 NOTE — Telephone Encounter (Signed)
Patient called, asked about seeing Dr Romeo AppleHarrison - appointment scheduled for first available 11/29/17.  She is asking if she may have refill of: diclofenac (CATAFLAM) 50 MG tablet 30 tablet  - [facility] is her pharmacy

## 2017-11-10 NOTE — Telephone Encounter (Signed)
I did it.

## 2017-11-29 ENCOUNTER — Encounter: Payer: Self-pay | Admitting: Orthopedic Surgery

## 2017-11-29 ENCOUNTER — Telehealth: Payer: Self-pay | Admitting: Radiology

## 2017-11-29 ENCOUNTER — Ambulatory Visit: Payer: Medicare Other | Admitting: Orthopedic Surgery

## 2017-11-29 VITALS — BP 166/76 | HR 74 | Ht 63.0 in | Wt 195.0 lb

## 2017-11-29 DIAGNOSIS — M25512 Pain in left shoulder: Secondary | ICD-10-CM | POA: Diagnosis not present

## 2017-11-29 DIAGNOSIS — G8929 Other chronic pain: Secondary | ICD-10-CM | POA: Diagnosis not present

## 2017-11-29 DIAGNOSIS — M19019 Primary osteoarthritis, unspecified shoulder: Secondary | ICD-10-CM | POA: Diagnosis not present

## 2017-11-29 MED ORDER — DICLOFENAC POTASSIUM 50 MG PO TABS: 50.0000 mg | ORAL_TABLET | Freq: Every day | ORAL | 0 refills | Status: DC

## 2017-11-29 NOTE — Telephone Encounter (Signed)
Take naprosyn until it comes in

## 2017-11-29 NOTE — Telephone Encounter (Signed)
I called [facility], they have manufacturer back order on the Diclofenac 50, but they do have 75, patient is concerned about her stomach if you give her a higher dose.   So do you want to prescribe something else ? She does not wish to change pharmacies

## 2017-11-29 NOTE — Telephone Encounter (Signed)
Thanks I have called her to advise. Left message for her to call me back.

## 2017-11-29 NOTE — Progress Notes (Signed)
Progress Note   Patient ID: Maria Durham, female   DOB: Mar 09, 1929, 84 y.o.   MRN: [id number]  Chief Complaint  Patient presents with  . Shoulder Pain    left    84 year old female with chronic left shoulder and presents with pain and glenohumeral arthritis status post backorder.  She presents with persistent pain in the left shoulder     Review of Systems  Musculoskeletal: Positive for myalgias.   Current Meds  Medication Sig  . alendronate (FOSAMAX) 70 MG tablet Take 70 mg by mouth once a week. Reported on 01/07/2016  . aspirin 81 MG tablet Take 81 mg by mouth daily.  Marland Kitchen. levothyroxine (SYNTHROID, LEVOTHROID) 25 MCG tablet Take 25 mcg by mouth daily before breakfast.  . metoprolol tartrate (LOPRESSOR) 25 MG tablet Take 12.5 mg by mouth 2 (two) times daily.  . Multiple Vitamins-Minerals (MULTIVITAMIN PO) Take by mouth.  . nitroGLYCERIN (NITROSTAT) 0.4 MG SL tablet Place 0.4 mg under the tongue every 5 (five) minutes as needed. Reported on 01/07/2016  . pantoprazole (PROTONIX) 40 MG tablet Take 40 mg by mouth daily.  . valsartan-hydrochlorothiazide (DIOVAN-HCT) 320-25 MG tablet TAKE ONE TABLET BY MOUTH ONCE DAILY.  . vitamin C (ASCORBIC ACID) 500 MG tablet Take 500 mg by mouth daily.    Allergies  Allergen Reactions  . Fish Oil Hives  . Oxycontin [Oxycodone Hcl] Other (See Comments)    "made me crazy"     BP (!) 166/76   Pulse 74   Ht 5\' 3"  (1.6 m)   Wt 195 lb (88.5 kg)   BMI 34.54 kg/m   Physical Exam  Constitutional: She is oriented to person, place, and time. She appears well-developed and well-nourished.  Musculoskeletal:       Right shoulder: She exhibits decreased range of motion, tenderness, bony tenderness, crepitus and pain. She exhibits no swelling, no effusion, no deformity, no laceration, no spasm, normal pulse and normal strength.  Neurological: She is alert and oriented to person, place, and time.  Psychiatric: She has a normal mood and affect. Judgment  normal.  Vitals reviewed.    Medical decision-making Encounter Diagnoses  Name Primary?  Marland Kitchen. Arthritis, shoulder region Yes  . Chronic left shoulder pain     Procedure note the subacromial injection shoulder left   Verbal consent was obtained to inject the  Left   Shoulder  Timeout was completed to confirm the injection site is a subacromial space of the  left  shoulder  Medication used Depo-Medrol 40 mg and lidocaine 1% 3 cc  Anesthesia was provided by ethyl chloride  The injection was performed in the left  posterior subacromial space. After pinning the skin with alcohol and anesthetized the skin with ethyl chloride the subacromial space was injected using a 20-gauge needle. There were no complications  Sterile dressing was applied.   Meds ordered this encounter  Medications  . diclofenac (CATAFLAM) 50 MG tablet    Sig: Take 1 tablet (50 mg total) by mouth daily.    Dispense:  30 tablet    Refill:  0   Does not want shoulder surgery   Fu a needed   Fuller CanadaStanley Markesia Crilly, MD 11/29/2017 11:51 AM

## 2017-12-01 NOTE — Telephone Encounter (Signed)
I did speak to her, and she voiced understanding.

## 2018-02-08 ENCOUNTER — Other Ambulatory Visit: Payer: Self-pay | Admitting: Radiology

## 2018-02-08 ENCOUNTER — Other Ambulatory Visit: Payer: Self-pay | Admitting: Cardiovascular Disease

## 2018-02-08 MED ORDER — DICLOFENAC SODIUM 75 MG PO TBEC
75.0000 mg | DELAYED_RELEASE_TABLET | Freq: Every day | ORAL | 2 refills | Status: DC
Start: 2018-02-08 — End: 2018-07-12

## 2018-02-08 NOTE — Telephone Encounter (Signed)
Refill request received via fax for Diclofenac, ok to change to 75 mg since 50mg  is on backorder?  Since she is 84y.o pended for once daily

## 2018-04-09 ENCOUNTER — Other Ambulatory Visit: Payer: Self-pay | Admitting: Cardiovascular Disease

## 2018-05-09 ENCOUNTER — Other Ambulatory Visit: Payer: Self-pay | Admitting: Cardiovascular Disease

## 2018-05-17 ENCOUNTER — Other Ambulatory Visit: Payer: Self-pay

## 2018-05-17 MED ORDER — VALSARTAN-HYDROCHLOROTHIAZIDE 320-25 MG PO TABS: 1.0000 | ORAL_TABLET | Freq: Every day | ORAL | 1 refills | Status: DC

## 2018-06-16 ENCOUNTER — Other Ambulatory Visit: Payer: Self-pay | Admitting: Cardiovascular Disease

## 2018-07-12 ENCOUNTER — Ambulatory Visit: Payer: Medicare Other | Admitting: Cardiovascular Disease

## 2018-07-12 ENCOUNTER — Encounter: Payer: Self-pay | Admitting: Cardiovascular Disease

## 2018-07-12 VITALS — BP 140/70 | HR 69 | Ht 66.0 in | Wt 205.0 lb

## 2018-07-12 DIAGNOSIS — Z955 Presence of coronary angioplasty implant and graft: Secondary | ICD-10-CM | POA: Diagnosis not present

## 2018-07-12 DIAGNOSIS — I10 Essential (primary) hypertension: Secondary | ICD-10-CM | POA: Diagnosis not present

## 2018-07-12 DIAGNOSIS — I25118 Atherosclerotic heart disease of native coronary artery with other forms of angina pectoris: Secondary | ICD-10-CM

## 2018-07-12 MED ORDER — VALSARTAN-HYDROCHLOROTHIAZIDE 320-25 MG PO TABS: 1.0000 | ORAL_TABLET | Freq: Every day | ORAL | 3 refills | Status: DC

## 2018-07-12 NOTE — Patient Instructions (Signed)

## 2018-07-12 NOTE — Progress Notes (Addendum)
SUBJECTIVE: The patient presents for past due follow-up.  I have not seen her in approximately 2-1/2 years. In summary, she has a history of coronary artery disease with percutaneous coronary intervention with a stent placed to the proximal RCA (3.5 x 24 mm Promus Premier Monorail) for a 95% stenosis on 02/02/13 at [facility] by Dr. Dorena Cookey.   Nuclear stress testing on 01/19/16 demonstrated a very mild region of ischemia of mild intensity in the mid lateral wall. It was deemed a low risk study, calculated LVEF 69%.  Echocardiogram on 01/05/16 showed normal left ventricular systolic function, EF 60-65%, grade 1 diastolic dysfunction, and mild mitral and tricuspid regurgitation.  The patient denies any symptoms of chest pain, palpitations, shortness of breath, lightheadedness, dizziness, leg swelling, orthopnea, PND, and syncope.  She has been working at [employer] in [place] since July 10, 1996 which is the date it opened.  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  She has some left shoulder arthritis which she periodically gets injections for from orthopedic surgery.  She sees her PCP about once every 3 months.  ECG performed in the office today which I ordered and personally interpreted demonstrates normal sinus rhythm with first-degree AV block and left bundle branch block.   Review of Systems: As per "subjective", otherwise negative.  Allergies  Allergen Reactions  . Fish Oil Hives  . Oxycontin [Oxycodone Hcl] Other (See Comments)    "made me crazy"    Current Outpatient Medications  Medication Sig Dispense Refill  . aspirin 81 MG tablet Take 81 mg by mouth daily.    . diclofenac (CATAFLAM) 50 MG tablet Take 1 tablet (50 mg total) by mouth daily. 30 tablet 0  . diclofenac (VOLTAREN) 75 MG EC tablet Take 1 tablet (75 mg total) by mouth daily. 30 tablet 2  . levothyroxine (SYNTHROID, LEVOTHROID) 25 MCG tablet Take 25 mcg by mouth daily before breakfast.     . metoprolol tartrate (LOPRESSOR) 25 MG tablet Take 12.5 mg by mouth 2 (two) times daily.    . Multiple Vitamins-Minerals (MULTIVITAMIN PO) Take by mouth.    . nitroGLYCERIN (NITROSTAT) 0.4 MG SL tablet Place 0.4 mg under the tongue every 5 (five) minutes as needed. Reported on 01/07/2016    . pantoprazole (PROTONIX) 40 MG tablet Take 40 mg by mouth daily.    . valsartan-hydrochlorothiazide (DIOVAN-HCT) 320-25 MG tablet TAKE ONE TABLET BY MOUTH ONCE DAILY. 30 tablet 0  . vitamin C (ASCORBIC ACID) 500 MG tablet Take 500 mg by mouth daily.     No current facility-administered medications for this visit.     Past Medical History:  Diagnosis Date  . Coronary artery disease   . Diabetes mellitus   . Hypertension   . Obesity    BMI 35.4    Past Surgical History:  Procedure Laterality Date  . CARDIAC CATHETERIZATION  2014   with stent placement   . CHOLECYSTECTOMY      Social History   Socioeconomic History  . Marital status: Single    Spouse name: Not on file  . Number of children: Not on file  . Years of education: Not on file  . Highest education level: Not on file  Occupational History  . Not on file  Social Needs  . Financial resource strain: Not on file  . Food insecurity:    Worry: Not on file    Inability: Not on file  . Transportation needs:  Medical: Not on file    Non-medical: Not on file  Tobacco Use  . Smoking status: Never Smoker  . Smokeless tobacco: Never Used  Substance and Sexual Activity  . Alcohol use: No    Alcohol/week: 0.0 standard drinks  . Drug use: No  . Sexual activity: Not on file  Lifestyle  . Physical activity:    Days per week: Not on file    Minutes per session: Not on file  . Stress: Not on file  Relationships  . Social connections:    Talks on phone: Not on file    Gets together: Not on file    Attends religious service: Not on file    Active member of club or organization: Not on file    Attends meetings of clubs or  organizations: Not on file    Relationship status: Not on file  . Intimate partner violence:    Fear of current or ex partner: Not on file    Emotionally abused: Not on file    Physically abused: Not on file    Forced sexual activity: Not on file  Other Topics Concern  . Not on file  Social History Narrative  . Not on file     Vitals:   07/12/18 1553  BP: 140/70  Pulse: 69  SpO2: 95%  Weight: 205 lb (93 kg)  Height: 5\' 6"  (1.676 m)    Wt Readings from Last 3 Encounters:  07/12/18 205 lb (93 kg)  11/29/17 195 lb (88.5 kg)  06/09/16 205 lb (93 kg)     PHYSICAL EXAM General: NAD HEENT: Normal. Neck: No JVD, no thyromegaly. Lungs: Clear to auscultation bilaterally with normal respiratory effort. CV: Regular rate and rhythm, normal S1/S2, no S3/S4, no murmur. No pretibial or periankle edema.  No carotid bruit.   Abdomen: Soft, nontender, no distention.  Neurologic: Alert and oriented.  Psych: Normal affect. Skin: Normal. Musculoskeletal: No gross deformities.    ECG: Reviewed above under Subjective   Labs:    Lipids: Lab Results  Component Value Date/Time   [facility]  12/24/2009 12:22 AM    93        Total Cholesterol/HDL:CHD Risk Coronary Heart Disease Risk Table                     Men   Women  1/2 Average Risk   3.4   3.3  Average Risk       5.0   4.4  2 X Average Risk   9.6   7.1  3 X Average Risk  23.4   11.0        Use the calculated Patient Ratio above and the CHD Risk Table to determine the patient's CHD Risk.        ATP III CLASSIFICATION (LDL):  <100     mg/dL   Optimal  161-096100-129  mg/dL   Near or Above                    Optimal  130-159  mg/dL   Borderline  045-409160-189  mg/dL   High  >811>190     mg/dL   Very High   CHOL  91/47/829502/12/2009 12:22 AM    171        ATP III CLASSIFICATION:  <200     mg/dL   Desirable  621-308200-239  mg/dL   Borderline High  >=657>=240    mg/dL   High  TRIG 143 12/24/2009 12:22 AM   HDL 49 12/24/2009 12:22 AM        ASSESSMENT AND PLAN: 1.  Coronary artery disease with RCA stent: Symptomatically stable.  Continue aspirin and metoprolol.  Unclear why she is not on statin therapy.  I will obtain copy of lipids from PCP.  2.  Hypertension: Blood pressure is borderline elevated.  No changes to therapy today.  I will refill Diovan-HCTZ.    Disposition: Follow up 1 year   Prentice DockerSuresh Thaison Kolodziejski, M.D., F.A.C.C.

## 2018-09-07 ENCOUNTER — Encounter (INDEPENDENT_AMBULATORY_CARE_PROVIDER_SITE_OTHER): Payer: Self-pay | Admitting: Ophthalmology

## 2018-09-24 ENCOUNTER — Other Ambulatory Visit: Payer: Self-pay | Admitting: Cardiovascular Disease

## 2019-01-29 ENCOUNTER — Other Ambulatory Visit: Payer: Self-pay | Admitting: Cardiovascular Disease

## 2019-06-18 ENCOUNTER — Other Ambulatory Visit: Payer: Self-pay | Admitting: Cardiovascular Disease

## 2019-07-13 ENCOUNTER — Other Ambulatory Visit: Payer: Self-pay | Admitting: Cardiovascular Disease

## 2019-07-31 ENCOUNTER — Telehealth: Payer: Self-pay | Admitting: Cardiovascular Disease

## 2019-07-31 MED ORDER — VALSARTAN-HYDROCHLOROTHIAZIDE 320-25 MG PO TABS: 1.0000 | ORAL_TABLET | Freq: Every day | ORAL | 0 refills | Status: DC

## 2019-07-31 MED ORDER — METOPROLOL TARTRATE 25 MG PO TABS: 12.5000 mg | ORAL_TABLET | Freq: Two times a day (BID) | ORAL | 3 refills | Status: DC

## 2019-07-31 NOTE — Telephone Encounter (Signed)
Please call in refills on patient's meds up until 12/20. She scheduled to see SK then. / tg

## 2019-07-31 NOTE — Telephone Encounter (Signed)
Done

## 2019-11-01 ENCOUNTER — Other Ambulatory Visit: Payer: Self-pay | Admitting: Cardiovascular Disease

## 2019-11-08 ENCOUNTER — Ambulatory Visit (INDEPENDENT_AMBULATORY_CARE_PROVIDER_SITE_OTHER): Payer: Medicare Other | Admitting: Cardiovascular Disease

## 2019-11-08 ENCOUNTER — Encounter: Payer: Self-pay | Admitting: Cardiovascular Disease

## 2019-11-08 VITALS — Ht 63.0 in | Wt 197.0 lb

## 2019-11-08 DIAGNOSIS — Z955 Presence of coronary angioplasty implant and graft: Secondary | ICD-10-CM | POA: Diagnosis not present

## 2019-11-08 DIAGNOSIS — Z7189 Other specified counseling: Secondary | ICD-10-CM

## 2019-11-08 DIAGNOSIS — I2510 Atherosclerotic heart disease of native coronary artery without angina pectoris: Secondary | ICD-10-CM

## 2019-11-08 DIAGNOSIS — I10 Essential (primary) hypertension: Secondary | ICD-10-CM

## 2019-11-08 DIAGNOSIS — I25118 Atherosclerotic heart disease of native coronary artery with other forms of angina pectoris: Secondary | ICD-10-CM

## 2019-11-08 NOTE — Progress Notes (Signed)
Virtual Visit via Telephone Note   This visit type was conducted due to national recommendations for restrictions regarding the COVID-19 Pandemic (e.g. social distancing) in an effort to limit this patient's exposure and mitigate transmission in our community.  Due to her co-morbid illnesses, this patient is at least at moderate risk for complications without adequate follow up.  This format is felt to be most appropriate for this patient at this time.  The patient did not have access to video technology/had technical difficulties with video requiring transitioning to audio format only (telephone).  All issues noted in this document were discussed and addressed.  No physical exam could be performed with this format.  Please refer to the patient's chart for her  consent to telehealth for [facility].   Date:  11/08/2019   ID:  Maria Durham, DOB 1929-03-15, MRN [id number]  Patient Location: Home Provider Location: Office  PCP:  Kirstie PeriShah, Ashish, MD  Cardiologist:  Prentice DockerSuresh Kennadie Brenner, MD  Electrophysiologist:  None   Evaluation Performed:  Follow-Up Visit  Chief Complaint:  CAD  History of Present Illness:    Maria Durham is a 85 y.o. female with a history of coronary artery disease with percutaneous coronary intervention with a stent placed to the proximal RCA (3.5 x 24 mm Promus Premier Monorail) for a 95% stenosis on 02/02/13 [facility] by Dr. Dorena CookeyKutcher.   The patient denies any symptoms of chest pain, palpitations, shortness of breath, lightheadedness, dizziness, leg swelling, orthopnea, PND, and syncope.  She had been working at [employer] in [place] since July 10, 1996 which is the date it opened.  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 She hasn't worked there since the pandemic began as they closed the dining room.     Past Medical History:  Diagnosis Date  . Coronary artery disease   . Diabetes mellitus   . Hypertension   . Obesity    BMI 35.4   Past Surgical History:  Procedure Laterality Date  . CARDIAC CATHETERIZATION  2014   with stent placement   . CHOLECYSTECTOMY       Current Meds  Medication Sig  . aspirin 81 MG tablet Take 81 mg by mouth daily.  Marland Kitchen. levothyroxine (SYNTHROID) 75 MCG tablet Take 75 mcg by mouth daily before breakfast.  . metoprolol tartrate (LOPRESSOR) 25 MG tablet Take 0.5 tablets (12.5 mg total) by mouth 2 (two) times daily.  . Multiple Vitamins-Minerals (MULTIVITAMIN PO) Take by mouth.  . nitroGLYCERIN (NITROSTAT) 0.4 MG SL tablet Place 0.4 mg under the tongue every 5 (five) minutes as needed. Reported on 01/07/2016  . pantoprazole (PROTONIX) 40 MG tablet Take 40 mg by mouth daily.  . valsartan-hydrochlorothiazide (DIOVAN-HCT) 320-25 MG tablet TAKE 1 TABLET BY MOUTH ONCE A DAY.  . vitamin C (ASCORBIC ACID) 500 MG tablet Take 500 mg by mouth daily.  . [DISCONTINUED] levothyroxine (SYNTHROID, LEVOTHROID) 25 MCG tablet Take 25 mcg by mouth daily before breakfast.     Allergies:   Fish oil and Oxycontin [oxycodone hcl]   Social History   Tobacco Use  . Smoking status: Never Smoker  . Smokeless tobacco: Never Used  Substance Use Topics  . Alcohol use: No    Alcohol/week: 0.0 standard drinks  . Drug use: No     Family Hx: The patient's family history includes Coronary artery disease in an other family member; Diabetes in her mother.  ROS:   Please see the history of  present illness.     All other systems reviewed and are negative.   Prior CV studies:   The following studies were reviewed today:  Nuclear stress testing on 01/19/16 demonstrated a very mild region of ischemia of mild intensity in the mid lateral wall. It was deemed a low risk study, calculated LVEF 69%.  Echocardiogram on 01/05/16 showed normal left ventricular systolic function, EF 75-10%, grade 1 diastolic dysfunction, and mild mitral and tricuspid regurgitation.  Labs/Other Tests and Data Reviewed:    EKG:  No  ECG reviewed.  Recent Labs: No results found for requested labs within last 8760 hours.   Recent Lipid Panel Lab Results  Component Value Date/Time   CHOL  12/24/2009 12:22 AM    171        ATP III CLASSIFICATION:  <200     mg/dL   Desirable  200-239  mg/dL   Borderline High  >=240    mg/dL   High          TRIG 143 12/24/2009 12:22 AM   HDL 49 12/24/2009 12:22 AM   CHOLHDL 3.5 12/24/2009 12:22 AM   LDLCALC  12/24/2009 12:22 AM    93        Total Cholesterol/HDL:CHD Risk Coronary Heart Disease Risk Table                     Men   Women  1/2 Average Risk   3.4   3.3  Average Risk       5.0   4.4  2 X Average Risk   9.6   7.1  3 X Average Risk  23.4   11.0        Use the calculated Patient Ratio above and the CHD Risk Table to determine the patient's CHD Risk.        ATP III CLASSIFICATION (LDL):  <100     mg/dL   Optimal  100-129  mg/dL   Near or Above                    Optimal  130-159  mg/dL   Borderline  160-189  mg/dL   High  >190     mg/dL   Very High    Wt Readings from Last 3 Encounters:  11/08/19 197 lb (89.4 kg)  07/12/18 205 lb (93 kg)  11/29/17 195 lb (88.5 kg)     Objective:    Vital Signs:  Ht 5\' 3"  (1.6 m)   Wt 197 lb (89.4 kg)   BMI 34.90 kg/m    VITAL SIGNS:  reviewed  ASSESSMENT & PLAN:    1.  Coronary artery disease with RCA stent: Symptomatically stable.  Continue aspirin and metoprolol.  Unclear why she is not on statin therapy.   2.  Hypertension: No change to therapy.   COVID-19 Education: The signs and symptoms of COVID-19 were discussed with the patient and how to seek care for testing (follow up with PCP or arrange E-visit).  The importance of social distancing was discussed today.  Time:   Today, I have spent 5 minutes with the patient with telehealth technology discussing the above problems.     Medication Adjustments/Labs and Tests Ordered: Current medicines are reviewed at length with the patient today.  Concerns  regarding medicines are outlined above.   Tests Ordered: No orders of the defined types were placed in this encounter.   Medication Changes: No orders of the defined types were  placed in this encounter.   Follow Up:  Either In Person or Virtual in 1 year(s)  Signed, Prentice Docker, MD  11/08/2019 10:07 AM    [facility]

## 2019-11-08 NOTE — Patient Instructions (Signed)
Medication Instructions:  Your physician recommends that you continue on your current medications as directed. Please refer to the Current Medication list given to you today.  *If you need a refill on your cardiac medications before your next appointment, please call your pharmacy*  Lab Work: None today If you have labs (blood work) drawn today and your tests are completely normal, you will receive your results only by: . MyChart Message (if you have MyChart) OR . A paper copy in the mail If you have any lab test that is abnormal or we need to change your treatment, we will call you to review the results.  Testing/Procedures: None today  Follow-Up: At [facility], you and your health needs are our priority.  As part of our continuing mission to provide you with exceptional heart care, we have created designated Provider Care Teams.  These Care Teams include your primary Cardiologist (physician) and Advanced Practice Providers (APPs -  Physician Assistants and Nurse Practitioners) who all work together to provide you with the care you need, when you need it.  Your next appointment:   12 months  The format for your next appointment:   In Person  Provider:   Suresh Koneswaran, MD  Other Instructions None      Thank you for choosing [facility] !         

## 2020-01-31 ENCOUNTER — Other Ambulatory Visit: Payer: Self-pay | Admitting: Cardiovascular Disease

## 2020-09-30 ENCOUNTER — Encounter: Payer: Self-pay | Admitting: Cardiology

## 2020-09-30 NOTE — Progress Notes (Signed)
Cardiology Office Note  Date: 10/01/2020   ID: Maria Durham, DOB 02/01/1929, MRN [id number]  PCP:  Kirstie Peri, MD  Cardiologist:  Nona Dell, MD Electrophysiologist:  None   Chief Complaint  Patient presents with  . Cardiac follow-up    History of Present Illness: Maria Durham is an 86 y.o. female former patient of Dr. Purvis Sheffield now presenting to establish follow-up with me.  I reviewed her records and updated the chart.  She was last seen in December 2020.  She is here today with her daughter with whom she has lived for the last year. She does not describe any obvious angina or nitroglycerin use.  Does have dependent leg edema, gets better when she puts her feet up in a recliner or overnight.  She just had a follow-up visit with her PCP with lab work obtained, we are requesting the results for review.  I reviewed her present medications which are listed below.  She does not report any intolerances.  She does need refills on Diovan HCT.  Today's blood pressure was elevated, she had not taken her antihypertensives yet.  Blood pressure at PCP office was much better recently, reportedly 116/70.  Cardiac catheterization at [facility] in March 2014 revealed a 95% proximal RCA stenosis, 50% mid RCA stenosis, 30% mid LAD stenosis, and 50% mid circumflex stenosis.  The proximal RCA was treated with DES intervention and she was otherwise managed medically.  Follow-up Myoview in 2017 as noted below showing evidence of soft tissue attenuation as well as mild lateral ischemia with normal LVEF.  I personally reviewed her ECG today which shows sinus rhythm with left bundle branch block, old.  Past Medical History:  Diagnosis Date  . Coronary artery disease    DES to proximal RCA March 2014 [facility]  . Essential hypertension   . Obesity    BMI 35.4  . Type 2 diabetes mellitus (HCC)     Past Surgical History:  Procedure Laterality Date  . CARDIAC CATHETERIZATION  2014   with stent  placement   . CHOLECYSTECTOMY      Current Outpatient Medications  Medication Sig Dispense Refill  . aspirin 81 MG tablet Take 81 mg by mouth daily.    Marland Kitchen levothyroxine (SYNTHROID) 75 MCG tablet Take 75 mcg by mouth daily before breakfast.    . metoprolol tartrate (LOPRESSOR) 25 MG tablet Take 0.5 tablets (12.5 mg total) by mouth 2 (two) times daily. 180 tablet 3  . Multiple Vitamins-Minerals (MULTIVITAMIN PO) Take by mouth.    . Multiple Vitamins-Minerals (PRESERVISION AREDS 2+MULTI VIT PO) Take 1 tablet by mouth daily.    . nitroGLYCERIN (NITROSTAT) 0.4 MG SL tablet Place 0.4 mg under the tongue every 5 (five) minutes as needed. Reported on 01/07/2016    . pantoprazole (PROTONIX) 40 MG tablet Take 40 mg by mouth daily.    . valsartan-hydrochlorothiazide (DIOVAN-HCT) 320-25 MG tablet TAKE 1 TABLET BY MOUTH ONCE A DAY. 90 tablet 1  . vitamin C (ASCORBIC ACID) 500 MG tablet Take 500 mg by mouth daily.     No current facility-administered medications for this visit.   Allergies:  Fish oil and Oxycontin [oxycodone hcl]   ROS: Hearing loss.  Physical Exam: VS:  BP (!) 158/80   Pulse 64   Ht 5\' 3"  (1.6 m)   Wt 204 lb (92.5 kg)   BMI 36.14 kg/m , BMI Body mass index is 36.14 kg/m.  Wt Readings from Last 3 Encounters:  10/01/20  204 lb (92.5 kg)  11/08/19 197 lb (89.4 kg)  07/12/18 205 lb (93 kg)    General: Elderly woman, appears comfortable at rest. HEENT: Conjunctiva and lids normal, wearing a mask. Neck: Supple, no elevated JVP or carotid bruits, no thyromegaly. Lungs: Clear to auscultation, nonlabored breathing at rest. Cardiac: Regular rate and rhythm, no S3, soft systolic murmur, no pericardial rub. Extremities: Mild ankle edema, distal pulses 2+.  ECG:  An ECG dated 07/12/2018 was personally reviewed today and demonstrated:  Sinus rhythm with left bundle branch block.  Recent Labwork:  No recent lab work for review today.  Other Studies Reviewed Today:  Echocardiogram  01/05/2016 [facility]): LVEF 60 to 65% with grade 1 diastolic dysfunction, normal RV contraction, mild left atrial enlargement, mild mitral regurgitation with mild annular calcification, sclerotic aortic valve without stenosis, mild tricuspid regurgitation.  Lexiscan Myoview 01/19/2016:  Left bundle-branch block present throughout study.  Small, mild intensity apical anteroseptal defect that is fixed and most consistent with soft tissue attenuation.  Small, mild intensity, mid lateral defect is partially reversible and suggestive of a mild region of ischemia.  This is a low risk study.  Nuclear stress EF: 69%.  Assessment and Plan:  1.  CAD with history of DES to the proximal RCA in 2014 at [facility].  ECG reviewed and stable with old left bundle branch block.  No definite angina or increasing nitroglycerin use, does have dyspnea on exertion.  Continue medical therapy and observation.  Lopressor, and Diovan HCT.  Currently not on statin therapy.  Requesting recent lab work from PCP for review.  2.  Essential hypertension, blood pressure up today but she had not yet taken her morning medications.  Her daughter is a Engineer, civil (consulting) and will check blood pressure periodically.  Continue Diovan HCT, prescription refill provided.  She is also on Lopressor.  Medication Adjustments/Labs and Tests Ordered: Current medicines are reviewed at length with the patient today.  Concerns regarding medicines are outlined above.   Tests Ordered: Orders Placed This Encounter  Procedures  . EKG 12-Lead    Medication Changes: No orders of the defined types were placed in this encounter.   Disposition:  Follow up 1 year in the [place] office.  Signed, Jonelle Sidle, MD, [facility] 10/01/2020 10:23 AM    [facility] at [facility] [address] [place], [place], [place] [address] Phone: [phone]; Fax: [phone]

## 2020-10-01 ENCOUNTER — Ambulatory Visit (INDEPENDENT_AMBULATORY_CARE_PROVIDER_SITE_OTHER): Payer: Medicare Other | Admitting: Cardiology

## 2020-10-01 ENCOUNTER — Other Ambulatory Visit: Payer: Self-pay

## 2020-10-01 ENCOUNTER — Encounter: Payer: Self-pay | Admitting: Cardiology

## 2020-10-01 ENCOUNTER — Encounter: Payer: Self-pay | Admitting: *Deleted

## 2020-10-01 VITALS — BP 158/80 | HR 64 | Ht 63.0 in | Wt 204.0 lb

## 2020-10-01 DIAGNOSIS — I10 Essential (primary) hypertension: Secondary | ICD-10-CM

## 2020-10-01 DIAGNOSIS — I25119 Atherosclerotic heart disease of native coronary artery with unspecified angina pectoris: Secondary | ICD-10-CM

## 2020-10-01 NOTE — Patient Instructions (Signed)

## 2020-10-03 ENCOUNTER — Telehealth: Payer: Self-pay | Admitting: *Deleted

## 2020-10-03 DIAGNOSIS — Z79899 Other long term (current) drug therapy: Secondary | ICD-10-CM

## 2020-10-03 DIAGNOSIS — E782 Mixed hyperlipidemia: Secondary | ICD-10-CM

## 2020-10-03 MED ORDER — ROSUVASTATIN CALCIUM 5 MG PO TABS: 5.0000 mg | ORAL_TABLET | Freq: Every day | ORAL | 1 refills | Status: DC

## 2020-10-03 NOTE — Telephone Encounter (Signed)
Patient informed and agrees to starting crestor.

## 2020-10-03 NOTE — Telephone Encounter (Signed)
Would check FLP and LFTs in 3 months after starting Crestor.

## 2020-10-03 NOTE — Telephone Encounter (Signed)
-----  Message from Satira Sark, MD sent at 10/03/2020 12:07 PM EST ----- Results reviewed.  We met recently in the office for follow-up.  I did review her lab work from PCP.  LDL 122.  With ischemic heart disease we would generally recommend statin therapy, I was not sure whether she had tried this in the past with Dr. Bronson Ing or not.  Would consider starting Crestor 5 mg daily if she is in agreement.

## 2020-10-03 NOTE — Telephone Encounter (Signed)
Orders mailed to home address.

## 2020-11-03 ENCOUNTER — Other Ambulatory Visit: Payer: Self-pay | Admitting: *Deleted

## 2020-11-03 MED ORDER — VALSARTAN-HYDROCHLOROTHIAZIDE 320-25 MG PO TABS: 1.0000 | ORAL_TABLET | Freq: Every day | ORAL | 1 refills | Status: DC

## 2020-11-10 ENCOUNTER — Ambulatory Visit (INDEPENDENT_AMBULATORY_CARE_PROVIDER_SITE_OTHER): Payer: Medicare Other

## 2020-11-10 ENCOUNTER — Other Ambulatory Visit: Payer: Self-pay

## 2020-11-10 ENCOUNTER — Ambulatory Visit
Admission: RE | Admit: 2020-11-10 | Discharge: 2020-11-10 | Disposition: A | Discharge status: Home / Self Care | Payer: Medicare Other | Source: Ambulatory Visit | Attending: Urgent Care | Admitting: Urgent Care

## 2020-11-10 ENCOUNTER — Encounter: Payer: Self-pay | Admitting: Cardiology

## 2020-11-10 VITALS — BP 101/68 | HR 68 | Temp 98.3°F | Resp 17

## 2020-11-10 DIAGNOSIS — R059 Cough, unspecified: Secondary | ICD-10-CM | POA: Diagnosis not present

## 2020-11-10 DIAGNOSIS — R062 Wheezing: Secondary | ICD-10-CM | POA: Diagnosis not present

## 2020-11-10 DIAGNOSIS — J069 Acute upper respiratory infection, unspecified: Secondary | ICD-10-CM

## 2020-11-10 DIAGNOSIS — R0989 Other specified symptoms and signs involving the circulatory and respiratory systems: Secondary | ICD-10-CM

## 2020-11-10 DIAGNOSIS — R0981 Nasal congestion: Secondary | ICD-10-CM

## 2020-11-10 MED ORDER — PREDNISONE 20 MG PO TABS: 20.0000 mg | ORAL_TABLET | Freq: Every day | ORAL | 0 refills | Status: DC

## 2020-11-10 MED ORDER — BENZONATATE 100 MG PO CAPS: 100.0000 mg | ORAL_CAPSULE | Freq: Three times a day (TID) | ORAL | 0 refills | Status: DC | PRN

## 2020-11-10 NOTE — ED Triage Notes (Signed)
Pt presents with nasal stuffiness and cough after sitting outside on Saturday, pt declines covid and flu testing

## 2020-11-10 NOTE — ED Provider Notes (Addendum)
[facility]   MRN: [id number] DOB: 03-Nov-1929  Subjective:   Maria Durham is a 86 y.o. female presenting for 2-day history of acute onset nasal congestion, chest congestion, worsening coughing shortness of breath.  Symptoms started after patient was sitting outside in cold air for a while.  Patient presents with her daughter who reports a history of CAD but no history of heart failure.  She is hypertensive and diabetic patient.  Denies history of smoking, asthma.  Denies having had COVID vaccination, does not want to be Covid tested.  No current facility-administered medications for this encounter.  Current Outpatient Medications:  .  aspirin 81 MG tablet, Take 81 mg by mouth daily., Disp: , Rfl:  .  levothyroxine (SYNTHROID) 75 MCG tablet, Take 75 mcg by mouth daily before breakfast., Disp: , Rfl:  .  metoprolol tartrate (LOPRESSOR) 25 MG tablet, Take 0.5 tablets (12.5 mg total) by mouth 2 (two) times daily., Disp: 180 tablet, Rfl: 3 .  Multiple Vitamins-Minerals (MULTIVITAMIN PO), Take by mouth., Disp: , Rfl:  .  Multiple Vitamins-Minerals (PRESERVISION AREDS 2+MULTI VIT PO), Take 1 tablet by mouth daily., Disp: , Rfl:  .  nitroGLYCERIN (NITROSTAT) 0.4 MG SL tablet, Place 0.4 mg under the tongue every 5 (five) minutes as needed. Reported on 01/07/2016, Disp: , Rfl:  .  pantoprazole (PROTONIX) 40 MG tablet, Take 40 mg by mouth daily., Disp: , Rfl:  .  rosuvastatin (CRESTOR) 5 MG tablet, Take 1 tablet (5 mg total) by mouth daily., Disp: 90 tablet, Rfl: 1 .  valsartan-hydrochlorothiazide (DIOVAN-HCT) 320-25 MG tablet, Take 1 tablet by mouth daily., Disp: 90 tablet, Rfl: 1 .  vitamin C (ASCORBIC ACID) 500 MG tablet, Take 500 mg by mouth daily., Disp: , Rfl:    Allergies  Allergen Reactions  . Fish Oil Hives  . Oxycontin [Oxycodone Hcl] Other (See Comments)    "made me crazy"    Past Medical History:  Diagnosis Date  . Coronary artery disease    DES to proximal RCA  March 2014 [facility]  . Essential hypertension   . Obesity    BMI 35.4  . Type 2 diabetes mellitus (HCC)      Past Surgical History:  Procedure Laterality Date  . CARDIAC CATHETERIZATION  2014   with stent placement   . CHOLECYSTECTOMY      Family History  Problem Relation Age of Onset  . Diabetes Mother   . Coronary artery disease Other     Social History   Tobacco Use  . Smoking status: Never Smoker  . Smokeless tobacco: Never Used  Vaping Use  . Vaping Use: Never used  Substance Use Topics  . Alcohol use: No    Alcohol/week: 0.0 standard drinks  . Drug use: No    ROS   Objective:   Vitals: BP 101/68 (BP Location: Right Arm)   Pulse 68   Temp 98.3 F (36.8 C) (Oral)   Resp 17   SpO2 96%   Physical Exam Constitutional:      General: She is not in acute distress.    Appearance: Normal appearance. She is well-developed. She is ill-appearing. She is not toxic-appearing or diaphoretic.  HENT:     Head: Normocephalic and atraumatic.     Nose: Nose normal.     Mouth/Throat:     Mouth: Mucous membranes are moist.  Eyes:     Extraocular Movements: Extraocular movements intact.     Pupils: Pupils are equal, round, and reactive  to light.  Cardiovascular:     Rate and Rhythm: Normal rate and regular rhythm.     Pulses: Normal pulses.     Heart sounds: Normal heart sounds. No murmur heard. No friction rub. No gallop.   Pulmonary:     Effort: Pulmonary effort is normal. No respiratory distress.     Breath sounds: No stridor. Wheezing and rhonchi present. No rales.  Skin:    General: Skin is warm and dry.     Findings: No rash.  Neurological:     Mental Status: She is alert and oriented to person, place, and time.  Psychiatric:        Mood and Affect: Mood normal.        Behavior: Behavior normal.        Thought Content: Thought content normal.    DG Chest 2 View  Result Date: 11/10/2020 CLINICAL DATA:  Wheezing and cough. EXAM: CHEST - 2 VIEW  COMPARISON:  12/23/2009 FINDINGS: The heart size is stable. Aortic calcifications are noted. There is no pneumothorax or large pleural effusion. No focal infiltrate. There is no acute osseous abnormality. There are end-stage degenerative changes of the left glenohumeral joint. IMPRESSION: No active cardiopulmonary disease. Electronically Signed   By: Katherine Mantle M.D.   On: 11/10/2020 16:08   Assessment and Plan :   PDMP not reviewed this encounter.  1. Viral URI with cough   2. Nasal congestion   3. Rhonchi   4. Wheezing     Suspect viral URI, viral syndrome; physical exam findings reassuring and vital signs stable for discharge. Advised supportive care, offered symptomatic relief.  Given her lung sounds, recommended an oral prednisone course at 20 mg only.  Counseled patient on potential for adverse effects with medications prescribed/recommended today, ER and return-to-clinic precautions discussed, patient verbalized understanding.     Wallis Bamberg, PA-C 11/10/20 1619  Supervising provider PA-Grey Schlauch is Dr. Demaris Callander.    Wallis Bamberg, PA-C 11/10/20 1827

## 2020-12-22 ENCOUNTER — Encounter (INDEPENDENT_AMBULATORY_CARE_PROVIDER_SITE_OTHER): Payer: Self-pay | Admitting: Ophthalmology

## 2020-12-22 ENCOUNTER — Other Ambulatory Visit: Payer: Self-pay | Admitting: Cardiology

## 2020-12-22 ENCOUNTER — Other Ambulatory Visit: Payer: Self-pay

## 2020-12-22 ENCOUNTER — Telehealth: Payer: Self-pay | Admitting: Cardiology

## 2020-12-22 ENCOUNTER — Ambulatory Visit (INDEPENDENT_AMBULATORY_CARE_PROVIDER_SITE_OTHER): Payer: Medicare Other | Admitting: Ophthalmology

## 2020-12-22 DIAGNOSIS — H353124 Nonexudative age-related macular degeneration, left eye, advanced atrophic with subfoveal involvement: Secondary | ICD-10-CM | POA: Diagnosis not present

## 2020-12-22 DIAGNOSIS — H353113 Nonexudative age-related macular degeneration, right eye, advanced atrophic without subfoveal involvement: Secondary | ICD-10-CM

## 2020-12-22 DIAGNOSIS — H43813 Vitreous degeneration, bilateral: Secondary | ICD-10-CM | POA: Insufficient documentation

## 2020-12-22 MED ORDER — METOPROLOL TARTRATE 25 MG PO TABS: 12.5000 mg | ORAL_TABLET | Freq: Two times a day (BID) | ORAL | 3 refills | Status: DC

## 2020-12-22 NOTE — Progress Notes (Signed)
12/22/2020     CHIEF COMPLAINT Patient presents for Blurred Vision (Pt referred by Dr. Nile Riggs for Dry AMD./Pt states she has trouble reading and seeing things close up. Pt states she occasional sees a large dark spot while laying in bed at night with OU  open. Pt c/o OU being very watery.)   HISTORY OF PRESENT ILLNESS: Maria Durham is a 87 y.o. female who presents to the clinic today for:   HPI    Blurred Vision    In both eyes.  Onset was gradual.  Vision is blurred.  Severity is moderate.  Occurring constantly.  It is worse throughout the day and when reading.  Context:  near vision.  Since onset it is stable.  Associated symptoms include tearing.  Treatments tried include no treatments.  Response to treatment was no improvement.  I, the attending physician,  performed the HPI with the patient and updated documentation appropriately. Additional comments: Pt referred by Dr. Nile Riggs for Dry AMD. Pt states she has trouble reading and seeing things close up. Pt states she occasional sees a large dark spot while laying in bed at night with OU  open. Pt c/o OU being very watery.       Last edited by Elyse Jarvis on 12/22/2020 10:08 AM. (History)      Referring physician: [facility] [address],  [place] [address]  HISTORICAL INFORMATION:   Selected notes from the MEDICAL RECORD NUMBER    Lab Results  Component Value Date   HGBA1C (H) 12/23/2009    6.5 (NOTE) The ADA recommends the following therapeutic goal for glycemic control related to Hgb A1c measurement: Goal of therapy: <6.5 Hgb A1c  Reference: American Diabetes Association: Clinical Practice Recommendations 2010, Diabetes Care, 2010, 33: (Suppl  1).     CURRENT MEDICATIONS: No current outpatient medications on file. (Ophthalmic Drugs)   No current facility-administered medications for this visit. (Ophthalmic Drugs)   Current Outpatient Medications (Other)  Medication Sig  . aspirin 81 MG tablet  Take 81 mg by mouth daily.  . benzonatate (TESSALON) 100 MG capsule Take 1-2 capsules (100-200 mg total) by mouth 3 (three) times daily as needed.  Marland Kitchen levothyroxine (SYNTHROID) 75 MCG tablet Take 75 mcg by mouth daily before breakfast.  . metoprolol tartrate (LOPRESSOR) 25 MG tablet Take 0.5 tablets (12.5 mg total) by mouth 2 (two) times daily.  . Multiple Vitamins-Minerals (MULTIVITAMIN PO) Take by mouth.  . Multiple Vitamins-Minerals (PRESERVISION AREDS 2+MULTI VIT PO) Take 1 tablet by mouth daily.  . nitroGLYCERIN (NITROSTAT) 0.4 MG SL tablet Place 0.4 mg under the tongue every 5 (five) minutes as needed. Reported on 01/07/2016  . pantoprazole (PROTONIX) 40 MG tablet Take 40 mg by mouth daily.  . predniSONE (DELTASONE) 20 MG tablet Take 1 tablet (20 mg total) by mouth daily with breakfast.  . rosuvastatin (CRESTOR) 5 MG tablet Take 1 tablet (5 mg total) by mouth daily.  . valsartan-hydrochlorothiazide (DIOVAN-HCT) 320-25 MG tablet Take 1 tablet by mouth daily.  . vitamin C (ASCORBIC ACID) 500 MG tablet Take 500 mg by mouth daily.   No current facility-administered medications for this visit. (Other)      REVIEW OF SYSTEMS:    ALLERGIES Allergies  Allergen Reactions  . Fish Oil Hives  . Oxycontin [Oxycodone Hcl] Other (See Comments)    "made me crazy"    PAST MEDICAL HISTORY Past Medical History:  Diagnosis Date  . Coronary artery disease  DES to proximal RCA March 2014 [facility]  . Essential hypertension   . Obesity    BMI 35.4  . Type 2 diabetes mellitus (HCC)    Past Surgical History:  Procedure Laterality Date  . CARDIAC CATHETERIZATION  2014   with stent placement   . CHOLECYSTECTOMY      FAMILY HISTORY Family History  Problem Relation Age of Onset  . Diabetes Mother   . Coronary artery disease Other     SOCIAL HISTORY Social History   Tobacco Use  . Smoking status: Never Smoker  . Smokeless tobacco: Never Used  Vaping Use  . Vaping Use: Never used   Substance Use Topics  . Alcohol use: No    Alcohol/week: 0.0 standard drinks  . Drug use: No         OPHTHALMIC EXAM:  Base Eye Exam    Visual Acuity (Snellen - Linear)      Right Left   Dist cc 20/60 -1 20/100 -1   Dist ph cc  NI   Correction: Glasses       Tonometry (Tonopen, 10:14 AM)      Right Left   Pressure 17 17       Pupils      Pupils Dark Light Shape React APD   Right PERRL 3 2 Round Slow None   Left PERRL 3 2 Round Slow None       Visual Fields (Counting fingers)      Left Right    Full Full       Neuro/Psych    Oriented x3: Yes   Mood/Affect: Normal       Dilation    Both eyes: 1.0% Mydriacyl, 2.5% Phenylephrine @ 10:14 AM        Slit Lamp and Fundus Exam    External Exam      Right Left   External Normal Normal       Slit Lamp Exam      Right Left   Lids/Lashes Normal Normal   Conjunctiva/Sclera White and quiet White and quiet   Cornea Clear Clear   Anterior Chamber Deep and quiet Deep and quiet   Iris Round and reactive Round and reactive   Lens Centered posterior chamber intraocular lens Centered posterior chamber intraocular lens   Anterior Vitreous Normal Normal       Fundus Exam      Right Left   Posterior Vitreous Posterior vitreous detachment Posterior vitreous detachment   Disc Normal Normal   C/D Ratio 0.45 0.4   Macula Hard drusen, Geographic atrophy, Intermediate age related macular degeneration Hard drusen, Geographic atrophy with early RPE change in the foveal, Intermediate age related macular degeneration   Vessels Normal Normal   Periphery Normal Normal          IMAGING AND PROCEDURES  Imaging and Procedures for 12/22/20  OCT, Retina - OU - Both Eyes       Right Eye Quality was good. Scan locations included subfoveal. Central Foveal Thickness: 243. Progression has no prior data. Findings include no IRF, no SRF, abnormal foveal contour, retinal drusen , outer retinal atrophy.   Left Eye Quality was  good. Scan locations included subfoveal. Central Foveal Thickness: 264. Progression has no prior data. Findings include no SRF, abnormal foveal contour, retinal drusen , outer retinal atrophy.                 ASSESSMENT/PLAN:  Posterior vitreous detachment of both eyes   The  nature of posterior vitreous detachment was discussed with the patient as well as its physiology, its age prevalence, and its possible implication regarding retinal breaks and detachment.  An informational brochure was given to the patient.  All the patient's questions were answered.  The patient was asked to return if new or different flashes or floaters develops.   Patient was instructed to contact office immediately if any changes were noticed. I explained to the patient that vitreous inside the eye is similar to jello inside a bowl. As the jello melts it can start to pull away from the bowl, similarly the vitreous throughout our lives can begin to pull away from the retina. That process is called a posterior vitreous detachment. In some cases, the vitreous can tug hard enough on the retina to form a retinal tear. I discussed with the patient the signs and symptoms of a retinal detachment.  Do not rub the eye.  Advanced nonexudative age-related macular degeneration of left eye with subfoveal involvement No signs of CNVM by OCT today  Advanced nonexudative age-related macular degeneration of right eye without subfoveal involvement No signs of active CNVM OU today  The nature of age realated macular degeneration (ARMD)is explained as follows: The dry form refers to the progressive loss of normal blood supply to the central vision as a result of a combination of factors which include aging blood supply (arteriosclerosis, hardening of the arteries), genetics, smoking habits, and history of hypertension. Currently, no eye medications or vitamins slow this type of aging effect upon vision, however cessation of smoking and  controlling hypertension help slow the disorder. The following analogy helps explain this: I describe the dry form of ARMD like a house of the same age as your eyes, which shows typical wear and tear of age upon the house structure and appearance. Like the aging house which can fall down structurally, the dry form of ARMD can deteriorate the structure of the macula (center) of the retina, most often gradually and affect central vision tasks such as reading and driving. The wet form of ARMD refers to the development of abnormally growing blood vessels usually near or under the central vision, with potential risk of permanent visual changes or vision losses. This complication of the Dry form of ARMD may be moderately reduced by use of AREDS 2 formula multivitamins daily. I describe the Wet form of ARMD as like the development of a fire in an aging house (DRY ARMD). It may develop suddenly, progress rapidly and be destructive based on where it starts and how big the fire is when found. In the eye, the house fire analogy refers to the abnormal blood vessels growing destructively near the central vison in the retina, or film of the eye. Halting the growth of blood vessels with laser (hot or cold) or injectable medications is best way "to put the fire out". Many patients will experience a stabilization or even improvement in vision with a treatment course, while others may still face a loss of vision. The use of injectable medications has revolutionized therapy and is currently the only proven therapy to provide the chance of stable or improved acuity from new and recent destructive wet ARMD.      ICD-10-CM   1. Advanced nonexudative age-related macular degeneration of right eye without subfoveal involvement  H35.3113 OCT, Retina - OU - Both Eyes  2. Posterior vitreous detachment of both eyes  H43.813   3. Advanced nonexudative age-related macular degeneration of left eye with  subfoveal involvement  H35.3124 OCT,  Retina - OU - Both Eyes    1.  No signs of intraretinal or subretinal fluid on high-definition OCT performance today.  2.  Follow-up closely as patient and family have noticed this slow progression of vision loss over the last 12 months.  We will establish a baseline fluorescein angiography upon next follow-up and OCT follow-up in 3 weeks.  3.  Ophthalmic Meds Ordered this visit:  No orders of the defined types were placed in this encounter.      Return in about 3 weeks (around 01/12/2021) for DILATE OU, COLOR FP, OPTOS FFA L/R, OCT.  There are no Patient Instructions on file for this visit.   Explained the diagnoses, plan, and follow up with the patient and they expressed understanding.  Patient expressed understanding of the importance of proper follow up care.   Alford Highland Benay Pomeroy M.D. Diseases & Surgery of the Retina and Vitreous [facility] 12/22/20     Abbreviations: M myopia (nearsighted); A astigmatism; H hyperopia (farsighted); P presbyopia; Mrx spectacle prescription;  CTL contact lenses; OD right eye; OS left eye; OU both eyes  XT exotropia; ET esotropia; PEK punctate epithelial keratitis; PEE punctate epithelial erosions; DES dry eye syndrome; MGD meibomian gland dysfunction; ATs artificial tears; PFAT's preservative free artificial tears; NSC nuclear sclerotic cataract; PSC posterior subcapsular cataract; ERM epi-retinal membrane; PVD posterior vitreous detachment; RD retinal detachment; DM diabetes mellitus; DR diabetic retinopathy; NPDR non-proliferative diabetic retinopathy; PDR proliferative diabetic retinopathy; CSME clinically significant macular edema; DME diabetic macular edema; dbh dot blot hemorrhages; CWS cotton wool spot; POAG primary open angle glaucoma; C/D cup-to-disc ratio; HVF humphrey visual field; GVF goldmann visual field; OCT optical coherence tomography; IOP intraocular pressure; BRVO Branch retinal vein occlusion; CRVO central retinal  vein occlusion; CRAO central retinal artery occlusion; BRAO branch retinal artery occlusion; RT retinal tear; SB scleral buckle; PPV pars plana vitrectomy; VH Vitreous hemorrhage; PRP panretinal laser photocoagulation; IVK intravitreal kenalog; VMT vitreomacular traction; MH Macular hole;  NVD neovascularization of the disc; NVE neovascularization elsewhere; AREDS age related eye disease study; ARMD age related macular degeneration; POAG primary open angle glaucoma; EBMD epithelial/anterior basement membrane dystrophy; ACIOL anterior chamber intraocular lens; IOL intraocular lens; PCIOL posterior chamber intraocular lens; Phaco/IOL phacoemulsification with intraocular lens placement; PRK photorefractive keratectomy; LASIK laser assisted in situ keratomileusis; HTN hypertension; DM diabetes mellitus; COPD chronic obstructive pulmonary disease

## 2020-12-22 NOTE — Assessment & Plan Note (Addendum)
No signs of active CNVM OU today  The nature of age realated macular degeneration (ARMD)is explained as follows: The dry form refers to the progressive loss of normal blood supply to the central vision as a result of a combination of factors which include aging blood supply (arteriosclerosis, hardening of the arteries), genetics, smoking habits, and history of hypertension. Currently, no eye medications or vitamins slow this type of aging effect upon vision, however cessation of smoking and controlling hypertension help slow the disorder. The following analogy helps explain this: I describe the dry form of ARMD like a house of the same age as your eyes, which shows typical wear and tear of age upon the house structure and appearance. Like the aging house which can fall down structurally, the dry form of ARMD can deteriorate the structure of the macula (center) of the retina, most often gradually and affect central vision tasks such as reading and driving. The wet form of ARMD refers to the development of abnormally growing blood vessels usually near or under the central vision, with potential risk of permanent visual changes or vision losses. This complication of the Dry form of ARMD may be moderately reduced by use of AREDS 2 formula multivitamins daily. I describe the Wet form of ARMD as like the development of a fire in an aging house (DRY ARMD). It may develop suddenly, progress rapidly and be destructive based on where it starts and how big the fire is when found. In the eye, the house fire analogy refers to the abnormal blood vessels growing destructively near the central vison in the retina, or film of the eye. Halting the growth of blood vessels with laser (hot or cold) or injectable medications is best way "to put the fire out". Many patients will experience a stabilization or even improvement in vision with a treatment course, while others may still face a loss of vision. The use of injectable  medications has revolutionized therapy and is currently the only proven therapy to provide the chance of stable or improved acuity from new and recent destructive wet ARMD.

## 2020-12-22 NOTE — Assessment & Plan Note (Signed)

## 2020-12-22 NOTE — Assessment & Plan Note (Signed)
No signs of CNVM by OCT today

## 2020-12-22 NOTE — Telephone Encounter (Signed)
°*  STAT* If patient is at the pharmacy, call can be transferred to refill team.   1. Which medications need to be refilled? (please list name of each medication and dose if known)   metoprolol tartrate (LOPRESSOR) 25 MG tablet [[id number]]   2. Which pharmacy/location (including street and city if local pharmacy) is medication to be sent to?  [facility]  3. Do they need a 30 day or 90 day supply?  90

## 2021-01-08 ENCOUNTER — Encounter: Payer: Self-pay | Admitting: *Deleted

## 2021-01-12 ENCOUNTER — Other Ambulatory Visit: Payer: Self-pay

## 2021-01-12 ENCOUNTER — Encounter (INDEPENDENT_AMBULATORY_CARE_PROVIDER_SITE_OTHER): Payer: Self-pay | Admitting: Ophthalmology

## 2021-01-12 ENCOUNTER — Ambulatory Visit (INDEPENDENT_AMBULATORY_CARE_PROVIDER_SITE_OTHER): Payer: Medicare Other | Admitting: Ophthalmology

## 2021-01-12 DIAGNOSIS — H353113 Nonexudative age-related macular degeneration, right eye, advanced atrophic without subfoveal involvement: Secondary | ICD-10-CM

## 2021-01-12 DIAGNOSIS — H353124 Nonexudative age-related macular degeneration, left eye, advanced atrophic with subfoveal involvement: Secondary | ICD-10-CM

## 2021-01-12 MED ORDER — FLUORESCEIN SODIUM 10 % IV SOLN
500.0000 mg | INTRAVENOUS | Status: AC | PRN
  Administered 2021-01-12: 500 mg via INTRAVENOUS

## 2021-01-12 NOTE — Assessment & Plan Note (Signed)

## 2021-01-12 NOTE — Assessment & Plan Note (Signed)
No signs of CNVM by OCT and FFA today we will continue to observe

## 2021-01-12 NOTE — Progress Notes (Signed)
01/12/2021     CHIEF COMPLAINT Patient presents for Retina Follow Up (3 Wk FP/FFA L/R//Pt denies noticeable changes to [place] OU since last visit. Pt denies ocular pain, flashes of light, or floaters OU. //)   HISTORY OF PRESENT ILLNESS: Maria Durham is a 87 y.o. female who presents to the clinic today for:   HPI    Retina Follow Up    Patient presents with  Dry AMD.  In both eyes.  This started 3 weeks ago.  Severity is mild.  Duration of 3 weeks.  Since onset it is stable. Additional comments: 3 Wk FP/FFA L/R  Pt denies noticeable changes to [place] OU since last visit. Pt denies ocular pain, flashes of light, or floaters OU.          Last edited by Ileana Roup, COA on 01/12/2021 10:57 AM. (History)      Referring physician: Kirstie Peri, MD [address] [place],  [place] [address]  HISTORICAL INFORMATION:   Selected notes from the MEDICAL RECORD NUMBER    Lab Results  Component Value Date   HGBA1C (H) 12/23/2009    6.5 (NOTE) The ADA recommends the following therapeutic goal for glycemic control related to Hgb A1c measurement: Goal of therapy: <6.5 Hgb A1c  Reference: American Diabetes Association: Clinical Practice Recommendations 2010, Diabetes Care, 2010, 33: (Suppl  1).     CURRENT MEDICATIONS: No current outpatient medications on file. (Ophthalmic Drugs)   No current facility-administered medications for this visit. (Ophthalmic Drugs)   Current Outpatient Medications (Other)  Medication Sig  . aspirin 81 MG tablet Take 81 mg by mouth daily.  . benzonatate (TESSALON) 100 MG capsule Take 1-2 capsules (100-200 mg total) by mouth 3 (three) times daily as needed.  Marland Kitchen levothyroxine (SYNTHROID) 75 MCG tablet Take 75 mcg by mouth daily before breakfast.  . metoprolol tartrate (LOPRESSOR) 25 MG tablet TAKE 1/2 TABLET BY MOUTH TWICE DAILY.  . Multiple Vitamins-Minerals (MULTIVITAMIN PO) Take by mouth.  . Multiple Vitamins-Minerals (PRESERVISION AREDS 2+MULTI VIT PO) Take 1 tablet  by mouth daily.  . nitroGLYCERIN (NITROSTAT) 0.4 MG SL tablet Place 0.4 mg under the tongue every 5 (five) minutes as needed. Reported on 01/07/2016  . pantoprazole (PROTONIX) 40 MG tablet Take 40 mg by mouth daily.  . predniSONE (DELTASONE) 20 MG tablet Take 1 tablet (20 mg total) by mouth daily with breakfast.  . rosuvastatin (CRESTOR) 5 MG tablet Take 1 tablet (5 mg total) by mouth daily.  . valsartan-hydrochlorothiazide (DIOVAN-HCT) 320-25 MG tablet Take 1 tablet by mouth daily.  . vitamin C (ASCORBIC ACID) 500 MG tablet Take 500 mg by mouth daily.   No current facility-administered medications for this visit. (Other)      REVIEW OF SYSTEMS:    ALLERGIES Allergies  Allergen Reactions  . Fish Oil Hives  . Oxycontin [Oxycodone Hcl] Other (See Comments)    "made me crazy"    PAST MEDICAL HISTORY Past Medical History:  Diagnosis Date  . Coronary artery disease    DES to proximal RCA March 2014 [facility]  . Essential hypertension   . Obesity    BMI 35.4  . Type 2 diabetes mellitus (HCC)    Past Surgical History:  Procedure Laterality Date  . CARDIAC CATHETERIZATION  2014   with stent placement   . CHOLECYSTECTOMY      FAMILY HISTORY Family History  Problem Relation Age of Onset  . Diabetes Mother   . Coronary artery disease Other  SOCIAL HISTORY Social History   Tobacco Use  . Smoking status: Never Smoker  . Smokeless tobacco: Never Used  Vaping Use  . Vaping Use: Never used  Substance Use Topics  . Alcohol use: No    Alcohol/week: 0.0 standard drinks  . Drug use: No         OPHTHALMIC EXAM: Base Eye Exam    Visual Acuity (ETDRS)      Right Left   Dist cc 20/60 -2 20/200 -1   Dist ph cc 20/60 +2 20/100 +2   Correction: Glasses       Tonometry (Tonopen, 10:57 AM)      Right Left   Pressure 16 15       Pupils      Pupils Dark Light Shape React APD   Right PERRL 3 2 Round Slow None   Left PERRL 3 2 Round Slow None       Visual Fields  (Counting fingers)      Left Right    Full Full       Extraocular Movement      Right Left    Full Full       Neuro/Psych    Oriented x3: Yes   Mood/Affect: Normal       Dilation    Both eyes: 1.0% Mydriacyl, 2.5% Phenylephrine @ 11:03 AM        Slit Lamp and Fundus Exam    External Exam      Right Left   External Normal Normal       Slit Lamp Exam      Right Left   Lids/Lashes Normal Normal   Conjunctiva/Sclera White and quiet White and quiet   Cornea Clear Clear   Anterior Chamber Deep and quiet Deep and quiet   Iris Round and reactive Round and reactive   Lens Centered posterior chamber intraocular lens Centered posterior chamber intraocular lens   Anterior Vitreous Normal Normal       Fundus Exam      Right Left   Posterior Vitreous Posterior vitreous detachment Posterior vitreous detachment   Disc Vit pap traction Normal   C/D Ratio 0.45 0.4   Macula Hard drusen, Geographic atrophy, Intermediate age related macular degeneration Hard drusen, Geographic atrophy with early RPE change in the foveal, Intermediate age related macular degeneration   Vessels Normal Normal   Periphery Normal Normal          IMAGING AND PROCEDURES  Imaging and Procedures for 01/12/21  OCT, Retina - OU - Both Eyes       Right Eye Quality was good. Scan locations included subfoveal. Central Foveal Thickness: 246. Progression has no prior data. Findings include no IRF, no SRF, abnormal foveal contour, retinal drusen , outer retinal atrophy.   Left Eye Quality was good. Scan locations included subfoveal. Central Foveal Thickness: 288. Progression has no prior data. Findings include no SRF, abnormal foveal contour, retinal drusen , outer retinal atrophy.        Color Fundus Photography Optos - OU - Both Eyes       Right Eye Progression has no prior data. Disc findings include normal observations. Macula : geographic atrophy, drusen. Vessels : normal observations. Periphery  : normal observations.   Left Eye Progression has no prior data. Disc findings include normal observations. Macula : geographic atrophy, drusen. Vessels : normal observations. Periphery : normal observations.   Notes No signs of active CNVM OU.  Extensive geographic atrophy noted in  the macular region       Fluorescein Angiography Optos (Transit OS)       Injection:  500 mg Fluorescein Sodium 10 % injection   NDC: [phone]   Route: IntravenousRight Eye   Progression has no prior data. Mid/Late phase findings include window defect.   Left Eye   Progression has no prior data. Early phase findings include window defect. Mid/Late phase findings include window defect. Choroidal neovascularization is not present.   Notes No signs of active CNVM OU.  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 Checking an Amsler Grid daily with advice to return immediately should a distortion develop, was given to the patient. The patient 's smoking status now and in the past was determined and advice based on the AREDS study was provided regarding the consumption of antioxidant supplements. AREDS 2 vitamin formulation was recommended. Consumption of dark leafy vegetables and fresh fruits of various colors was recommended. Treatment modalities for wet macular degeneration particularly the use of intravitreal injections of anti-blood vessel growth factors was discussed with the patient. Avastin, Lucentis, and Eylea are the  available options. On occasion, therapy includes the use of photodynamic therapy and thermal laser. Stressed to the patient do not rub eyes.  Patient was advised to check Amsler Grid daily and return immediately if changes are noted. Instructions on using the grid were given to the patient. All patient questions were answered.      ICD-10-CM   1. Advanced nonexudative age-related macular degeneration of left eye with subfoveal involvement  H35.3124 OCT, Retina - OU - Both Eyes    Color Fundus Photography Optos - OU - Both Eyes    Fluorescein Angiography Optos (Transit OS)    Fluorescein Sodium 10 % injection 500 mg  2. Advanced nonexudative age-related macular degeneration of right eye without subfoveal involvement  H35.3113 OCT, Retina - OU - Both Eyes    Color Fundus Photography Optos - OU - Both Eyes    Fluorescein Angiography Optos (Transit OS)    Fluorescein Sodium 10 % injection 500 mg    1.  No signs of CNVM OU we will continue to monitor and observe patient to follow-up promptly new onset visual acuity distortion or decline  2.  3.  Ophthalmic Meds Ordered this visit:  Meds ordered this encounter  Medications  . Fluorescein Sodium 10 % injection 500 mg       Return in about 6 months (around 07/12/2021) for DILATE OU, COLOR FP, OCT.  There are no Patient Instructions on file for this visit.   Explained the diagnoses, plan, and follow up with the patient and they expressed understanding.  Patient expressed understanding of the importance of proper follow up care.   Alford HighlandGary A. Tjay Velazquez M.D. Diseases & Surgery of the Retina and Vitreous [facility] 01/12/21     Abbreviations: M myopia (nearsighted); A astigmatism; H hyperopia (farsighted); P presbyopia; Mrx spectacle prescription;  CTL contact lenses; OD right eye; OS left eye; OU both eyes  XT exotropia; ET esotropia; PEK punctate epithelial keratitis; PEE punctate epithelial erosions; DES dry eye syndrome;  MGD meibomian gland dysfunction; ATs artificial tears; PFAT's preservative free artificial tears; NSC nuclear sclerotic cataract; PSC posterior subcapsular cataract; ERM epi-retinal membrane; PVD posterior vitreous detachment; RD retinal detachment; DM diabetes mellitus; DR diabetic retinopathy; NPDR non-proliferative diabetic retinopathy; PDR proliferative diabetic retinopathy; CSME clinically significant macular edema; DME diabetic macular edema; dbh dot blot hemorrhages; CWS cotton wool spot; POAG primary open angle glaucoma; C/D cup-to-disc ratio; HVF humphrey visual field; GVF goldmann visual field; OCT optical coherence tomography; IOP intraocular pressure; BRVO Branch retinal vein occlusion; CRVO central retinal vein occlusion; CRAO central retinal artery occlusion; BRAO branch retinal artery occlusion; RT retinal tear; SB scleral buckle; PPV pars plana vitrectomy; VH Vitreous hemorrhage; PRP panretinal laser photocoagulation; IVK intravitreal kenalog; VMT vitreomacular traction; MH Macular hole;  NVD neovascularization of the disc; NVE neovascularization elsewhere; AREDS age related eye disease study; ARMD age related macular degeneration; POAG primary open angle glaucoma; EBMD epithelial/anterior basement membrane dystrophy; ACIOL anterior chamber intraocular lens; IOL intraocular lens; PCIOL posterior chamber intraocular lens; Phaco/IOL phacoemulsification with intraocular lens placement; PRK photorefractive keratectomy; LASIK laser assisted in situ keratomileusis; HTN hypertension; DM diabetes mellitus; COPD chronic obstructive pulmonary disease

## 2021-01-21 ENCOUNTER — Telehealth: Payer: Self-pay | Admitting: *Deleted

## 2021-01-21 DIAGNOSIS — E782 Mixed hyperlipidemia: Secondary | ICD-10-CM

## 2021-01-21 DIAGNOSIS — I25119 Atherosclerotic heart disease of native coronary artery with unspecified angina pectoris: Secondary | ICD-10-CM

## 2021-01-21 DIAGNOSIS — Z79899 Other long term (current) drug therapy: Secondary | ICD-10-CM

## 2021-01-21 NOTE — Telephone Encounter (Signed)
-----  Message from Satira Sark, MD sent at 01/20/2021  1:59 PM EST ----- Results reviewed.  Reviewed lab work from PCP.  I met patient recently in the office.  In light of known history of CAD and LDL 166, see if she would be willing to start on Crestor 5 mg daily.

## 2021-01-21 NOTE — Telephone Encounter (Signed)
Patient informed and says she is already on crestor 5 mg daily at bedtime since October 03, 2020 and takes it most nights. Says her lipid profile has not been checked since November 2021.

## 2021-01-22 NOTE — Telephone Encounter (Signed)
Jonelle Sidle, MD  Eustace Moore, RN Thank you for clarifying this. I see now that the lab work I recently received is actually from November of last year. If that is the case, her LDL has actually gone up from the 120s to the 160s prior to starting Crestor 5 mg daily. If she has been consistently on the medication, let's go ahead and check an FLP and LFTs.

## 2021-01-22 NOTE — Telephone Encounter (Signed)
Patient informed and verbalized understanding of plan. Lab orders faxed to [facility].  

## 2021-01-26 ENCOUNTER — Telehealth: Payer: Self-pay | Admitting: *Deleted

## 2021-01-26 ENCOUNTER — Other Ambulatory Visit (HOSPITAL_COMMUNITY)
Admission: RE | Admit: 2021-01-26 | Discharge: 2021-01-26 | Disposition: A | Discharge status: Home / Self Care | Payer: Medicare Other | Source: Ambulatory Visit | Attending: Cardiology | Admitting: Cardiology

## 2021-01-26 DIAGNOSIS — I25119 Atherosclerotic heart disease of native coronary artery with unspecified angina pectoris: Secondary | ICD-10-CM | POA: Diagnosis present

## 2021-01-26 DIAGNOSIS — E782 Mixed hyperlipidemia: Secondary | ICD-10-CM

## 2021-01-26 DIAGNOSIS — Z79899 Other long term (current) drug therapy: Secondary | ICD-10-CM | POA: Insufficient documentation

## 2021-01-26 LAB — HEPATIC FUNCTION PANEL
ALT: 15 U/L (ref 0–44)
AST: 20 U/L (ref 15–41)
Albumin: 4 g/dL (ref 3.5–5.0)
Alkaline Phosphatase: 67 U/L (ref 38–126)
Bilirubin, Direct: 0.1 mg/dL (ref 0.0–0.2)
Indirect Bilirubin: 0.5 mg/dL (ref 0.3–0.9)
Total Bilirubin: 0.6 mg/dL (ref 0.3–1.2)
Total Protein: 6.6 g/dL (ref 6.5–8.1)

## 2021-01-26 LAB — LIPID PANEL
Cholesterol: 165 mg/dL (ref 0–200)
HDL: 52 mg/dL (ref >40)
LDL Cholesterol: 84 mg/dL (ref 0–99)
Total CHOL/HDL Ratio: 3.2 RATIO
Triglycerides: 143 mg/dL (ref <150)
VLDL: 29 mg/dL (ref 0–40)

## 2021-01-26 NOTE — Telephone Encounter (Signed)
-----   Message from Jonelle Sidle, MD sent at 01/26/2021  2:37 PM EST ----- Results reviewed.  LFTs are normal.  LDL has come down from 156 at Dr. Margaretmary Eddy office to 84 on Crestor.  Continue with current regimen.

## 2021-01-30 NOTE — Telephone Encounter (Signed)
Patient informed. Copy sent to PCP °

## 2021-07-13 ENCOUNTER — Ambulatory Visit (INDEPENDENT_AMBULATORY_CARE_PROVIDER_SITE_OTHER): Payer: Medicare Other | Admitting: Ophthalmology

## 2021-07-13 ENCOUNTER — Other Ambulatory Visit: Payer: Self-pay | Admitting: Cardiology

## 2021-07-13 ENCOUNTER — Other Ambulatory Visit: Payer: Self-pay

## 2021-07-13 ENCOUNTER — Encounter (INDEPENDENT_AMBULATORY_CARE_PROVIDER_SITE_OTHER): Payer: Self-pay | Admitting: Ophthalmology

## 2021-07-13 DIAGNOSIS — H353113 Nonexudative age-related macular degeneration, right eye, advanced atrophic without subfoveal involvement: Secondary | ICD-10-CM

## 2021-07-13 DIAGNOSIS — H26491 Other secondary cataract, right eye: Secondary | ICD-10-CM | POA: Diagnosis not present

## 2021-07-13 DIAGNOSIS — H353124 Nonexudative age-related macular degeneration, left eye, advanced atrophic with subfoveal involvement: Secondary | ICD-10-CM | POA: Diagnosis not present

## 2021-07-13 NOTE — Assessment & Plan Note (Signed)
Would suggest consideration of YAG capsulotomy right eye  Follow-up with Dr. Elmer Picker and or associate Dr. Alben Spittle for consideration of YAG capsulotomy OD

## 2021-07-13 NOTE — Assessment & Plan Note (Signed)
Stable acuity OSThe nature of age realated macular degeneration (ARMD)is explained as follows: The dry form refers to the progressive loss of normal blood supply to the central vision as a result of a combination of factors which include aging blood supply (arteriosclerosis, hardening of the arteries), genetics, smoking habits, and history of hypertension. Currently, no eye medications or vitamins slow this type of aging effect upon vision, however cessation of smoking and controlling hypertension help slow the disorder. The following analogy helps explain this: I describe the dry form of ARMD like a house of the same age as your eyes, which shows typical wear and tear of age upon the house structure and appearance. Like the aging house which can fall down structurally, the dry form of ARMD can deteriorate the structure of the macula (center) of the retina, most often gradually and affect central vision tasks such as reading and driving. The wet form of ARMD refers to the development of abnormally growing blood vessels usually near or under the central vision, with potential risk of permanent visual changes or vision losses. This complication of the Dry form of ARMD may be moderately reduced by use of AREDS 2 formula multivitamins daily. I describe the Wet form of ARMD as like the development of a fire in an aging house (DRY ARMD). It may develop suddenly, progress rapidly and be destructive based on where it starts and how big the fire is when found. In the eye, the house fire analogy refers to the abnormal blood vessels growing destructively near the central vison in the retina, or film of the eye. Halting the growth of blood vessels with laser (hot or cold) or injectable medications is best way "to put the fire out". Many patients will experience a stabilization or even improvement in vision with a treatment course, while others may still face a loss of vision. The use of injectable medications has revolutionized  therapy and is currently the only proven therapy to provide the chance of stable or improved acuity from new and recent destructive wet ARMD.

## 2021-07-13 NOTE — Assessment & Plan Note (Signed)
Overall stable acuity

## 2021-07-13 NOTE — Progress Notes (Signed)
07/13/2021     CHIEF COMPLAINT Patient presents for  Chief Complaint  Patient presents with   Retina Follow Up    3 Wk FP/FFA L/R  Pt denies noticeable changes to [facility] OU since last visit. Pt denies ocular pain, flashes of light, or floaters OU.         HISTORY OF PRESENT ILLNESS: Maria Durham is a 87 y.o. female who presents to the clinic today for:   HPI     Retina Follow Up           Diagnosis: Dry AMD   Laterality: both eyes   Onset: 9 months ago   Severity: mild   Duration: 9 months   Course: stable   Comments: 3 Wk FP/FFA L/R  Pt denies noticeable changes to [place] OU since last visit. Pt denies ocular pain, flashes of light, or floaters OU.            Comments   6 mos fu ou oct fp Patient states vision is stable and unchanged since last visit. Denies any new floaters or FOL.  A1C: 5.7 ~ in May BS: does not check       Last edited by Nelva Nay, COA on 07/13/2021  9:13 AM.      Referring physician: Kirstie Peri, MD [address] [place],  [place] [address]  HISTORICAL INFORMATION:   Selected notes from the MEDICAL RECORD NUMBER    Lab Results  Component Value Date   HGBA1C (H) 12/23/2009    6.5 (NOTE) The ADA recommends the following therapeutic goal for glycemic control related to Hgb A1c measurement: Goal of therapy: <6.5 Hgb A1c  Reference: American Diabetes Association: Clinical Practice Recommendations 2010, Diabetes Care, 2010, 33: (Suppl  1).     CURRENT MEDICATIONS: No current outpatient medications on file. (Ophthalmic Drugs)   No current facility-administered medications for this visit. (Ophthalmic Drugs)   Current Outpatient Medications (Other)  Medication Sig   aspirin 81 MG tablet Take 81 mg by mouth daily.   benzonatate (TESSALON) 100 MG capsule Take 1-2 capsules (100-200 mg total) by mouth 3 (three) times daily as needed.   levothyroxine (SYNTHROID) 75 MCG tablet Take 75 mcg by mouth daily before breakfast.   metoprolol  tartrate (LOPRESSOR) 25 MG tablet TAKE 1/2 TABLET BY MOUTH TWICE DAILY.   Multiple Vitamins-Minerals (MULTIVITAMIN PO) Take by mouth.   Multiple Vitamins-Minerals (PRESERVISION AREDS 2+MULTI VIT PO) Take 1 tablet by mouth daily.   nitroGLYCERIN (NITROSTAT) 0.4 MG SL tablet Place 0.4 mg under the tongue every 5 (five) minutes as needed. Reported on 01/07/2016   pantoprazole (PROTONIX) 40 MG tablet Take 40 mg by mouth daily.   predniSONE (DELTASONE) 20 MG tablet Take 1 tablet (20 mg total) by mouth daily with breakfast.   rosuvastatin (CRESTOR) 5 MG tablet Take 5 mg by mouth daily.   valsartan-hydrochlorothiazide (DIOVAN-HCT) 320-25 MG tablet Take 1 tablet by mouth daily.   vitamin C (ASCORBIC ACID) 500 MG tablet Take 500 mg by mouth daily.   No current facility-administered medications for this visit. (Other)      REVIEW OF SYSTEMS:    ALLERGIES Allergies  Allergen Reactions   Fish Oil Hives   Oxycontin [Oxycodone Hcl] Other (See Comments)    "made me crazy"    PAST MEDICAL HISTORY Past Medical History:  Diagnosis Date   Coronary artery disease    DES to proximal RCA March 2014 [facility]   Essential hypertension  Obesity    BMI 35.4   Type 2 diabetes mellitus (HCC)    Past Surgical History:  Procedure Laterality Date   CARDIAC CATHETERIZATION  2014   with stent placement    CHOLECYSTECTOMY      FAMILY HISTORY Family History  Problem Relation Age of Onset   Diabetes Mother    Coronary artery disease Other     SOCIAL HISTORY Social History   Tobacco Use   Smoking status: Never   Smokeless tobacco: Never  Vaping Use   Vaping Use: Never used  Substance Use Topics   Alcohol use: No    Alcohol/week: 0.0 standard drinks   Drug use: No         OPHTHALMIC EXAM:  Base Eye Exam     Visual Acuity (ETDRS)       Right Left   Dist cc 20/60 20/200   Dist ph cc NI NI    Correction: Glasses         Tonometry (Tonopen, 9:11 AM)       Right Left    Pressure 14 9         Pupils       Pupils Dark Light Shape React APD   Right PERRL 3 2 Round Slow None   Left PERRL 3 2 Round Slow None         Visual Fields (Counting fingers)       Left Right    Full Full         Extraocular Movement       Right Left    Full Full         Neuro/Psych     Oriented x3: Yes   Mood/Affect: Normal         Dilation     Both eyes: 1.0% Mydriacyl, 2.5% Phenylephrine @ 9:11 AM           Slit Lamp and Fundus Exam     External Exam       Right Left   External Normal Normal         Slit Lamp Exam       Right Left   Lids/Lashes Normal Normal   Conjunctiva/Sclera White and quiet White and quiet   Cornea Clear Clear   Anterior Chamber Deep and quiet Deep and quiet   Iris Round and reactive Round and reactive   Lens Centered posterior chamber intraocular lens, 2+ Posterior capsular opacification, in and inferior to the visual axis Centered posterior chamber intraocular lens   Anterior Vitreous Normal Normal         Fundus Exam       Right Left   Posterior Vitreous Posterior vitreous detachment Posterior vitreous detachment   Disc Vit pap traction Normal   C/D Ratio 0.45 0.4   Macula Hard drusen, Geographic atrophy, Intermediate age related macular degeneration Hard drusen, Geographic atrophy with early RPE change in the foveal, Intermediate age related macular degeneration   Vessels Normal Normal   Periphery Normal Normal            IMAGING AND PROCEDURES  Imaging and Procedures for 07/13/21  OCT, Retina - OU - Both Eyes       Right Eye Quality was good. Scan locations included subfoveal. Central Foveal Thickness: 233. Progression has no prior data. Findings include no IRF, no SRF, abnormal foveal contour, retinal drusen , outer retinal atrophy.   Left Eye Quality was good. Scan locations included subfoveal. Central Foveal Thickness: 250.  Progression has no prior data. Findings include no SRF, abnormal  foveal contour, retinal drusen , outer retinal atrophy.   Notes No signs of active CNVM progressive outer retinal atrophy is present     Color Fundus Photography Optos - OU - Both Eyes       Right Eye Progression has no prior data. Disc findings include normal observations. Macula : geographic atrophy, drusen. Vessels : normal observations. Periphery : normal observations.   Left Eye Progression has no prior data. Disc findings include normal observations. Macula : geographic atrophy, drusen. Vessels : normal observations. Periphery : normal observations.   Notes No signs of active CNVM OU.  Extensive geographic atrophy noted in the macular region             ASSESSMENT/PLAN:  Right posterior capsular opacification Would suggest consideration of YAG capsulotomy right eye  Follow-up with Dr. Elmer Picker and or associate Dr. 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 Currently, no eye medications or vitamins slow this type of aging effect upon vision, however cessation of smoking and controlling hypertension help slow the disorder. The following analogy helps explain this: I describe the dry form of ARMD like a house of the same age as your eyes, which shows typical wear and tear of age upon the house structure and appearance. Like the aging house which can fall down structurally, the dry form of ARMD can deteriorate  the structure of the macula (center) of the retina, most often gradually and affect central vision tasks such as reading and driving. The wet form of ARMD refers to the development of abnormally growing blood vessels usually near or under the central vision, with potential risk of permanent visual changes or vision losses. This complication of the Dry form of ARMD may be moderately reduced by use of AREDS 2 formula multivitamins daily. I describe the Wet form of ARMD as like the development of a fire in an aging house (DRY ARMD). It may develop suddenly, progress rapidly and be destructive based on where it starts and how big the fire is when found. In the eye, the house fire analogy refers to the abnormal blood vessels growing destructively near the central vison in the retina, or film of the eye. Halting the growth of blood vessels with laser (hot or cold) or injectable medications is best way "to put the fire out". Many patients will experience a stabilization or even improvement in vision with a treatment course, while others may still face a loss of vision. The use of injectable medications has revolutionized therapy and is currently the only proven therapy to provide the chance of stable or improved acuity from new and recent destructive wet ARMD.     ICD-10-CM   1. Advanced nonexudative age-related macular degeneration of left eye with subfoveal involvement  H35.3124 OCT, Retina - OU - Both Eyes    Color Fundus Photography Optos - OU - Both Eyes    2. Advanced nonexudative age-related macular degeneration of right eye without subfoveal involvement  H35.3113  OCT, Retina - OU - Both Eyes    Color Fundus Photography Optos - OU - Both Eyes    3. Right posterior capsular opacification  H26.491       OD, with early posterior capsule opacification nearing the center of the vision with acuity otherwise limited by dry AMD.  I do recommend consideration of YAG capsulotomy OD [facility]  Dr. [facility]icker[facility] or Dr. Alben SpittleWeaver  2.  OU no signs of CNVM  3.  Ophthalmic Meds Ordered this visit:  No orders of the defined types were placed in this encounter.      Return in about 9 months (around 04/12/2022) for DILATE OU, COLOR FP, OCT.  There are no Patient Instructions on file for this visit.   Explained the diagnoses, plan, and follow up with the patient and they expressed understanding.  Patient expressed understanding of the importance of proper follow up care.   Alford HighlandGary A. Rossanna Spitzley M.D. Diseases & Surgery of the Retina and Vitreous [facility] 07/13/21     Abbreviations: M myopia (nearsighted); A astigmatism; H hyperopia (farsighted); P presbyopia; Mrx spectacle prescription;  CTL contact lenses; OD right eye; OS left eye; OU both eyes  XT exotropia; ET esotropia; PEK punctate epithelial keratitis; PEE punctate epithelial erosions; DES dry eye syndrome; MGD meibomian gland dysfunction; ATs artificial tears; PFAT's preservative free artificial tears; NSC nuclear sclerotic cataract; PSC posterior subcapsular cataract; ERM epi-retinal membrane; PVD posterior vitreous detachment; RD retinal detachment; DM diabetes mellitus; DR diabetic retinopathy; NPDR non-proliferative diabetic retinopathy; PDR proliferative diabetic retinopathy; CSME clinically significant macular edema; DME diabetic macular edema; dbh dot blot hemorrhages; CWS cotton wool spot; POAG primary open angle glaucoma; C/D cup-to-disc ratio; HVF humphrey visual field; GVF goldmann visual field; OCT optical coherence tomography; IOP intraocular pressure; BRVO Branch retinal vein occlusion; CRVO central retinal vein occlusion; CRAO central retinal artery occlusion; BRAO branch retinal artery occlusion; RT retinal tear; SB scleral buckle; PPV pars plana vitrectomy; VH Vitreous hemorrhage; PRP panretinal laser photocoagulation; IVK intravitreal kenalog; VMT vitreomacular traction; MH Macular hole;  NVD  neovascularization of the disc; NVE neovascularization elsewhere; AREDS age related eye disease study; ARMD age related macular degeneration; POAG primary open angle glaucoma; EBMD epithelial/anterior basement membrane dystrophy; ACIOL anterior chamber intraocular lens; IOL intraocular lens; PCIOL posterior chamber intraocular lens; Phaco/IOL phacoemulsification with intraocular lens placement; PRK photorefractive keratectomy; LASIK laser assisted in situ keratomileusis; HTN hypertension; DM diabetes mellitus; COPD chronic obstructive pulmonary disease

## 2021-08-29 ENCOUNTER — Other Ambulatory Visit: Payer: Self-pay | Admitting: Cardiology

## 2021-11-11 ENCOUNTER — Encounter: Payer: Self-pay | Admitting: Cardiology

## 2021-11-11 ENCOUNTER — Ambulatory Visit: Payer: Medicare Other | Admitting: Cardiology

## 2021-11-11 VITALS — BP 122/62 | HR 56 | Ht 63.0 in | Wt 203.0 lb

## 2021-11-11 DIAGNOSIS — E782 Mixed hyperlipidemia: Secondary | ICD-10-CM | POA: Diagnosis not present

## 2021-11-11 DIAGNOSIS — Z79899 Other long term (current) drug therapy: Secondary | ICD-10-CM | POA: Diagnosis not present

## 2021-11-11 DIAGNOSIS — I25119 Atherosclerotic heart disease of native coronary artery with unspecified angina pectoris: Secondary | ICD-10-CM

## 2021-11-11 MED ORDER — NITROGLYCERIN 0.4 MG SL SUBL: 0.4000 mg | SUBLINGUAL_TABLET | SUBLINGUAL | 3 refills | Status: AC | PRN

## 2021-11-11 MED ORDER — ROSUVASTATIN CALCIUM 10 MG PO TABS
10.0000 mg | ORAL_TABLET | Freq: Every day | ORAL | 3 refills | Status: DC
Start: 2021-11-11 — End: 2023-01-31

## 2021-11-11 NOTE — Patient Instructions (Addendum)
Medication Instructions:  Your physician has recommended you make the following change in your medication:  Increase crestor to 10 mg daily Continue other medications the same  Labwork: Your physician recommends that you return for a FASTING lipid profile: in 2 months (01/12/2022). Please do not eat or drink for at least 8 hours when you have this done. You may take your medications that morning with a sip of water. [facility]  Testing/Procedures: none  Follow-Up: Your physician recommends that you schedule a follow-up appointment in: 1 year. You will receive a reminder call or letter in the mail in about 10 months reminding you to call and schedule your appointment. If you don't receive this letter, please contact our office.  Any Other Special Instructions Will Be Listed Below (If Applicable).  If you need a refill on your cardiac medications before your next appointment, please call your pharmacy.

## 2021-11-11 NOTE — Progress Notes (Signed)
Cardiology Office Note  Date: 11/11/2021   ID: Maria Durham, DOB 1929/05/29, MRN [id number]  PCP:  Kirstie Peri, MD  Cardiologist:  Nona Dell, MD Electrophysiologist:  None   Chief Complaint  Patient presents with   Cardiac follow-up    History of Present Illness: Maria Durham is a 87 y.o. female last seen in November 2021.  She is here for a follow-up visit with family member.  She does not report any active angina symptoms or nitroglycerin use.  We went over her medications which are noted below.  She does need a refill for fresh bottle of nitroglycerin.  Blood pressure was up when she first came in, rechecked by me at 122/62.  Lab work from March revealed LDL 84.  She has been on Crestor 5 mg daily.  I personally reviewed her ECG today which shows sinus bradycardia with prolonged PR interval and left bundle branch block.   Past Medical History:  Diagnosis Date   Coronary artery disease    DES to proximal RCA March 2014 [facility]   Essential hypertension    Obesity    BMI 35.4   Type 2 diabetes mellitus (HCC)     Past Surgical History:  Procedure Laterality Date   CARDIAC CATHETERIZATION  2014   with stent placement    CHOLECYSTECTOMY      Current Outpatient Medications  Medication Sig Dispense Refill   aspirin 81 MG tablet Take 81 mg by mouth daily.     levothyroxine (SYNTHROID) 75 MCG tablet Take 75 mcg by mouth daily before breakfast.     metoprolol tartrate (LOPRESSOR) 25 MG tablet TAKE 1/2 TABLET BY MOUTH TWICE DAILY. 45 tablet 3   Multiple Vitamins-Minerals (MULTIVITAMIN PO) Take by mouth.     Multiple Vitamins-Minerals (PRESERVISION AREDS 2+MULTI VIT PO) Take 1 tablet by mouth daily.     pantoprazole (PROTONIX) 40 MG tablet Take 40 mg by mouth daily.     rosuvastatin (CRESTOR) 10 MG tablet Take 1 tablet (10 mg total) by mouth daily. 90 tablet 3   valsartan-hydrochlorothiazide (DIOVAN-HCT) 320-25 MG tablet TAKE 1 TABLET BY MOUTH ONCE A DAY. 30  tablet 6   vitamin C (ASCORBIC ACID) 500 MG tablet Take 500 mg by mouth daily.     nitroGLYCERIN (NITROSTAT) 0.4 MG SL tablet Place 1 tablet (0.4 mg total) under the tongue every 5 (five) minutes x 3 doses as needed (if no relief after 2nd dose, proceed to ED for evaluation). Reported on 01/07/2016 25 tablet 3   No current facility-administered medications for this visit.   Allergies:  Fish oil and Oxycontin [oxycodone hcl]   ROS: No palpitations or syncope.  Physical Exam: VS:  BP 122/62    Pulse (!) 56    Ht 5\' 3"  (1.6 m)    Wt 203 lb (92.1 kg)    SpO2 92%    BMI 35.96 kg/m , BMI Body mass index is 35.96 kg/m.  Wt Readings from Last 3 Encounters:  11/11/21 203 lb (92.1 kg)  10/01/20 204 lb (92.5 kg)  11/08/19 197 lb (89.4 kg)    General: Patient appears comfortable at rest. HEENT: Conjunctiva and lids normal, wearing a mask. Neck: Supple, no elevated JVP or carotid bruits, no thyromegaly. Lungs: Clear to auscultation, nonlabored breathing at rest. Cardiac: Regular rate and rhythm, no S3, 1/6 systolic murmur. Extremities: Mild ankle edema.  ECG:  An ECG dated 10/01/2020 was personally reviewed today and demonstrated:  Sinus rhythm with left  bundle branch block.  Recent Labwork: 01/26/2021: ALT 15; AST 20     Component Value Date/Time   CHOL 165 01/26/2021 1220   TRIG 143 01/26/2021 1220   HDL 52 01/26/2021 1220   CHOLHDL 3.2 01/26/2021 1220   VLDL 29 01/26/2021 1220   LDLCALC 84 01/26/2021 1220  November 2021: Hemoglobin A1c 6.1%, hemoglobin 12.2, platelets 236, BUN 30, creatinine 1.0, potassium 5.0, AST 18, ALT 12, TSH 1.6  Other Studies Reviewed Today:  Echocardiogram 01/05/2016 [facility]): LVEF 60 to 65% with grade 1 diastolic dysfunction, normal RV contraction, mild left atrial enlargement, mild mitral regurgitation with mild annular calcification, sclerotic aortic valve without stenosis, mild tricuspid regurgitation.   Lexiscan Myoview 01/19/2016: Left  bundle-branch block present throughout study. Small, mild intensity apical anteroseptal defect that is fixed and most consistent with soft tissue attenuation. Small, mild intensity, mid lateral defect is partially reversible and suggestive of a mild region of ischemia. This is a low risk study. Nuclear stress EF: 69%.  Assessment and Plan:  1.  CAD status post DES to the proximal RCA in 2014.  She does not report any active angina on medical therapy and ECG is stable with old left bundle branch block.  Refill provided for fresh bottle of nitroglycerin.  Continue aspirin, Lopressor, Diovan HCT, and Crestor which will be increased to 10 mg daily.  Follow-up FLP in 8 weeks.  2.  Essential hypertension, blood pressure well controlled on recheck.  She does have a home blood pressure cuff as well.  No changes were made today.  Medication Adjustments/Labs and Tests Ordered: Current medicines are reviewed at length with the patient today.  Concerns regarding medicines are outlined above.   Tests Ordered: Orders Placed This Encounter  Procedures   Lipid panel   EKG 12-Lead    Medication Changes: Meds ordered this encounter  Medications   nitroGLYCERIN (NITROSTAT) 0.4 MG SL tablet    Sig: Place 1 tablet (0.4 mg total) under the tongue every 5 (five) minutes x 3 doses as needed (if no relief after 2nd dose, proceed to ED for evaluation). Reported on 01/07/2016    Dispense:  25 tablet    Refill:  3   rosuvastatin (CRESTOR) 10 MG tablet    Sig: Take 1 tablet (10 mg total) by mouth daily.    Dispense:  90 tablet    Refill:  3    11/11/2021 dose increase    Disposition:  Follow up  1 year.  Signed, Jonelle Sidle, MD, [facility] 11/11/2021 1:25 PM    [facility] at [facility] [address] [place], [place], [place] [address] Phone: [phone]; Fax: [phone]

## 2022-01-13 LAB — LIPID PANEL
Chol/HDL Ratio: 3.1 ratio (ref 0.0–4.4)
Cholesterol, Total: 168 mg/dL (ref 100–199)
HDL: 54 mg/dL (ref >39)
LDL Chol Calc (NIH): 89 mg/dL (ref 0–99)
Triglycerides: 145 mg/dL (ref 0–149)
VLDL Cholesterol Cal: 25 mg/dL (ref 5–40)

## 2022-03-30 ENCOUNTER — Other Ambulatory Visit: Payer: Self-pay | Admitting: Cardiology

## 2022-04-12 ENCOUNTER — Ambulatory Visit (INDEPENDENT_AMBULATORY_CARE_PROVIDER_SITE_OTHER): Payer: Medicare Other | Admitting: Ophthalmology

## 2022-04-12 ENCOUNTER — Encounter (INDEPENDENT_AMBULATORY_CARE_PROVIDER_SITE_OTHER): Payer: Self-pay | Admitting: Ophthalmology

## 2022-04-12 DIAGNOSIS — H353124 Nonexudative age-related macular degeneration, left eye, advanced atrophic with subfoveal involvement: Secondary | ICD-10-CM

## 2022-04-12 DIAGNOSIS — H353113 Nonexudative age-related macular degeneration, right eye, advanced atrophic without subfoveal involvement: Secondary | ICD-10-CM

## 2022-04-12 NOTE — Progress Notes (Signed)
04/12/2022     CHIEF COMPLAINT Patient presents for  Chief Complaint  Patient presents with   Macular Degeneration      HISTORY OF PRESENT ILLNESS: Maria Durham is a 88 y.o. female who presents to the clinic today for:   HPI   9 mos fu OU OCT FP. Patient states vision is stable and unchanged since last visit. Denies any new floaters or FOL. Denies hospitalizations, surgeries, or falls since last visit. Last edited by Laurin Coder on 04/12/2022  9:34 AM.      Referring physician: Rutherford Guys, [place],  [place] [address]  HISTORICAL INFORMATION:   Selected notes from the MEDICAL RECORD NUMBER    Lab Results  Component Value Date   HGBA1C (H) 12/23/2009    6.5 (NOTE) The ADA recommends the following therapeutic goal for glycemic control related to Hgb A1c measurement: Goal of therapy: <6.5 Hgb A1c  Reference: American Diabetes Association: Clinical Practice Recommendations 2010, Diabetes Care, 2010, 33: (Suppl  1).     CURRENT MEDICATIONS: No current outpatient medications on file. (Ophthalmic Drugs)   No current facility-administered medications for this visit. (Ophthalmic Drugs)   Current Outpatient Medications (Other)  Medication Sig   aspirin 81 MG tablet Take 81 mg by mouth daily.   levothyroxine (SYNTHROID) 75 MCG tablet Take 75 mcg by mouth daily before breakfast.   metoprolol tartrate (LOPRESSOR) 25 MG tablet TAKE 1/2 TABLET BY MOUTH TWICE DAILY.   Multiple Vitamins-Minerals (MULTIVITAMIN PO) Take by mouth.   Multiple Vitamins-Minerals (PRESERVISION AREDS 2+MULTI VIT PO) Take 1 tablet by mouth daily.   nitroGLYCERIN (NITROSTAT) 0.4 MG SL tablet Place 1 tablet (0.4 mg total) under the tongue every 5 (five) minutes x 3 doses as needed (if no relief after 2nd dose, proceed to ED for evaluation). Reported on 01/07/2016   pantoprazole (PROTONIX) 40 MG tablet Take 40 mg by mouth daily.   rosuvastatin (CRESTOR) 10 MG tablet Take 1 tablet  (10 mg total) by mouth daily.   valsartan-hydrochlorothiazide (DIOVAN-HCT) 320-25 MG tablet TAKE 1 TABLET BY MOUTH ONCE A DAY.   vitamin C (ASCORBIC ACID) 500 MG tablet Take 500 mg by mouth daily.   No current facility-administered medications for this visit. (Other)      REVIEW OF SYSTEMS: ROS   Negative for: Constitutional, Gastrointestinal, Neurological, Skin, Genitourinary, Musculoskeletal, HENT, Endocrine, Cardiovascular, Eyes, Respiratory, Psychiatric, Allergic/Imm, Heme/Lymph Last edited by Hurman Horn, MD on 04/12/2022 10:17 AM.       ALLERGIES Allergies  Allergen Reactions   Fish Oil Hives   Oxycontin [Oxycodone Hcl] Other (See Comments)    "made me crazy"    PAST MEDICAL HISTORY Past Medical History:  Diagnosis Date   Coronary artery disease    DES to proximal RCA March 2014 [facility]   Essential hypertension    Obesity    BMI 35.4   Type 2 diabetes mellitus ([place])    Past Surgical History:  Procedure Laterality Date   CARDIAC CATHETERIZATION  2014   with stent placement    CHOLECYSTECTOMY      FAMILY HISTORY Family History  Problem Relation Age of Onset   Diabetes Mother    Coronary artery disease Other     SOCIAL HISTORY Social History   Tobacco Use   Smoking status: Never   Smokeless tobacco: Never  Vaping Use   Vaping Use: Never used  Substance Use Topics   Alcohol use: No    Alcohol/week:  0.0 standard drinks   Drug use: No         OPHTHALMIC EXAM:  Base Eye Exam     Visual Acuity (ETDRS)       Right Left   Dist cc 20/50 +1 20/125 -1   Dist ph cc 20/40 NI    Correction: Glasses         Tonometry (Tonopen, 9:40 AM)       Right Left   Pressure 17 20         Pupils       Pupils Dark Light APD   Right PERRL 3 2 None   Left PERRL 3 2 None         Visual Fields (Counting fingers)       Left Right    Full Full         Extraocular Movement       Right Left    Full Full         Neuro/Psych      Oriented x3: Yes   Mood/Affect: Normal         Dilation     Both eyes: 1.0% Mydriacyl, 2.5% Phenylephrine @ 9:40 AM           Slit Lamp and Fundus Exam     External Exam       Right Left   External Normal Normal         Slit Lamp Exam       Right Left   Lids/Lashes Normal Normal   Conjunctiva/Sclera White and quiet White and quiet   Cornea Clear Clear   Anterior Chamber Deep and quiet Deep and quiet   Iris Round and reactive Round and reactive   Lens Centered posterior chamber intraocular lens, 2+ Posterior capsular opacification, in and inferior to the visual axis Centered posterior chamber intraocular lens   Anterior Vitreous Normal Normal         Fundus Exam       Right Left   Posterior Vitreous Posterior vitreous detachment Posterior vitreous detachment   Disc Vit pap traction Normal   C/D Ratio 0.45 0.4   Macula Hard drusen, Geographic atrophy in and near [facility] , Intermediate age related macular degeneration Hard drusen, Geographic atrophy in FAZ, AND RPE change in the fovea, Intermediate age related macular degeneration   Vessels Normal Normal   Periphery Normal Normal            IMAGING AND PROCEDURES  Imaging and Procedures for 04/12/22  OCT, Retina - OU - Both Eyes       Right Eye Quality was good. Scan locations included subfoveal. Central Foveal Thickness: 235. Progression has no prior data. Findings include no IRF, no SRF, abnormal foveal contour, retinal drusen , outer retinal atrophy.   Left Eye Quality was good. Scan locations included subfoveal. Central Foveal Thickness: 255. Progression has no prior data. Findings include no SRF, abnormal foveal contour, retinal drusen , outer retinal atrophy.   Notes No signs of active CNVM progressive outer retinal atrophy is present     Color Fundus Photography Optos - OU - Both Eyes       Right Eye Progression has no prior data. Disc findings include normal observations. Macula :  geographic atrophy, drusen. Vessels : normal observations. Periphery : normal observations.   Left Eye Progression has no prior data. Disc findings include normal observations. Macula : geographic atrophy, drusen. Vessels : normal observations. Periphery : normal observations.  Notes No signs of active CNVM OU.  Extensive geographic atrophy noted in the macular region but none foveal OD and subfoveal OS.  Will be excellent candidate for Syfovre OU             ASSESSMENT/PLAN:  Advanced nonexudative age-related macular degeneration of right eye without subfoveal involvement Geographic atrophy parafoveal. 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 Advanced nonexudative age-related macular degeneration of left eye with subfoveal involvement  H35.3124 OCT, Retina - OU - Both Eyes    Color Fundus Photography Optos - OU - Both Eyes    2. Advanced nonexudative age-related macular degeneration of right eye without subfoveal involvement  H35.3113 OCT, Retina - OU - Both Eyes    Color Fundus Photography Optos - OU - Both Eyes      1.  OU currently no sign of CNVM.  Stable overall.  2.  Clinic candidate for treatment to prevent progression of geographic RPE atrophy, dry AMD OU.  Follow-up in 5 months once the medication on the market available sometime after August 22, 2022  3.  Ophthalmic Meds Ordered this visit:  No orders of the defined types were placed in this encounter.      Return in about 5 months (around 09/12/2022) for DILATE OU, COLOR FP, OCT,, likely commence with treatment with Syfovre, OU.  There are no Patient Instructions on file for this visit.   Explained the diagnoses, plan, and follow up with the patient and they expressed understanding.  Patient expressed understanding of the  importance of proper follow up care.   Clent Demark Jaleal Schliep M.D. Diseases & Surgery of the Retina and Vitreous Retina & Diabetic [place] 04/12/22     Abbreviations: M myopia (nearsighted); A astigmatism; H hyperopia (farsighted); P presbyopia; Mrx spectacle prescription;  CTL contact lenses; OD right eye; OS left eye; OU both eyes  XT exotropia; ET esotropia; PEK punctate epithelial keratitis; PEE punctate epithelial erosions; DES dry eye syndrome; MGD meibomian gland dysfunction; ATs artificial tears; PFAT's preservative free artificial tears; [place] nuclear sclerotic cataract; PSC posterior subcapsular cataract; ERM epi-retinal membrane; PVD posterior vitreous detachment; RD retinal detachment; DM diabetes mellitus; DR diabetic retinopathy; NPDR non-proliferative diabetic retinopathy; PDR proliferative diabetic retinopathy; CSME clinically significant macular edema; DME diabetic macular edema; dbh dot blot hemorrhages; CWS cotton wool spot; POAG primary open angle glaucoma; C/D cup-to-disc ratio; HVF humphrey visual field; GVF goldmann visual field; OCT optical coherence tomography; IOP intraocular pressure; BRVO Branch retinal vein occlusion; CRVO central retinal vein occlusion; CRAO central retinal artery occlusion; BRAO branch retinal artery occlusion; RT retinal tear; SB scleral buckle; PPV pars plana vitrectomy; VH Vitreous hemorrhage; PRP panretinal laser photocoagulation; IVK intravitreal kenalog; VMT vitreomacular traction; MH Macular hole;  NVD neovascularization of the disc; NVE neovascularization elsewhere; AREDS age related eye disease study; ARMD age related macular degeneration; POAG primary open angle glaucoma; EBMD epithelial/anterior basement membrane dystrophy; ACIOL anterior chamber intraocular lens; IOL intraocular lens; PCIOL posterior chamber intraocular lens; Phaco/IOL phacoemulsification with intraocular lens placement; [place] photorefractive keratectomy; LASIK laser assisted in situ  keratomileusis; HTN hypertension; DM diabetes mellitus; COPD chronic obstructive pulmonary disease

## 2022-04-12 NOTE — Assessment & Plan Note (Signed)
Geographic atrophy parafoveal. Candidate for Syfovre in the future

## 2022-04-12 NOTE — Assessment & Plan Note (Signed)
Subfoveal atrophy cancer acuity, candidate for Syfovre in the future

## 2022-08-11 ENCOUNTER — Emergency Department (HOSPITAL_COMMUNITY): Payer: Medicare Other

## 2022-08-11 ENCOUNTER — Encounter (HOSPITAL_COMMUNITY): Payer: Self-pay | Admitting: *Deleted

## 2022-08-11 ENCOUNTER — Observation Stay (HOSPITAL_COMMUNITY)
Admission: EM | Admit: 2022-08-11 | Discharge: 2022-08-14 | Disposition: A | Discharge status: Home / Self Care | Payer: Medicare Other | Attending: Internal Medicine | Admitting: Internal Medicine

## 2022-08-11 ENCOUNTER — Other Ambulatory Visit: Payer: Self-pay

## 2022-08-11 DIAGNOSIS — Z79899 Other long term (current) drug therapy: Secondary | ICD-10-CM | POA: Diagnosis not present

## 2022-08-11 DIAGNOSIS — Z95818 Presence of other cardiac implants and grafts: Secondary | ICD-10-CM | POA: Diagnosis not present

## 2022-08-11 DIAGNOSIS — I2510 Atherosclerotic heart disease of native coronary artery without angina pectoris: Secondary | ICD-10-CM | POA: Insufficient documentation

## 2022-08-11 DIAGNOSIS — E119 Type 2 diabetes mellitus without complications: Secondary | ICD-10-CM | POA: Diagnosis not present

## 2022-08-11 DIAGNOSIS — I441 Atrioventricular block, second degree: Secondary | ICD-10-CM | POA: Diagnosis not present

## 2022-08-11 DIAGNOSIS — E1169 Type 2 diabetes mellitus with other specified complication: Secondary | ICD-10-CM | POA: Insufficient documentation

## 2022-08-11 DIAGNOSIS — N1831 Chronic kidney disease, stage 3a: Secondary | ICD-10-CM | POA: Diagnosis not present

## 2022-08-11 DIAGNOSIS — D649 Anemia, unspecified: Secondary | ICD-10-CM

## 2022-08-11 DIAGNOSIS — E8809 Other disorders of plasma-protein metabolism, not elsewhere classified: Secondary | ICD-10-CM

## 2022-08-11 DIAGNOSIS — Z7982 Long term (current) use of aspirin: Secondary | ICD-10-CM | POA: Insufficient documentation

## 2022-08-11 DIAGNOSIS — K219 Gastro-esophageal reflux disease without esophagitis: Secondary | ICD-10-CM | POA: Diagnosis not present

## 2022-08-11 DIAGNOSIS — E785 Hyperlipidemia, unspecified: Secondary | ICD-10-CM | POA: Diagnosis not present

## 2022-08-11 DIAGNOSIS — I10 Essential (primary) hypertension: Secondary | ICD-10-CM | POA: Diagnosis present

## 2022-08-11 DIAGNOSIS — I440 Atrioventricular block, first degree: Secondary | ICD-10-CM

## 2022-08-11 DIAGNOSIS — I129 Hypertensive chronic kidney disease with stage 1 through stage 4 chronic kidney disease, or unspecified chronic kidney disease: Secondary | ICD-10-CM | POA: Diagnosis not present

## 2022-08-11 DIAGNOSIS — R2689 Other abnormalities of gait and mobility: Secondary | ICD-10-CM | POA: Insufficient documentation

## 2022-08-11 DIAGNOSIS — E039 Hypothyroidism, unspecified: Secondary | ICD-10-CM

## 2022-08-11 DIAGNOSIS — R072 Precordial pain: Secondary | ICD-10-CM | POA: Diagnosis present

## 2022-08-11 DIAGNOSIS — E1122 Type 2 diabetes mellitus with diabetic chronic kidney disease: Secondary | ICD-10-CM | POA: Insufficient documentation

## 2022-08-11 DIAGNOSIS — R079 Chest pain, unspecified: Secondary | ICD-10-CM | POA: Diagnosis present

## 2022-08-11 DIAGNOSIS — R770 Abnormality of albumin: Secondary | ICD-10-CM | POA: Insufficient documentation

## 2022-08-11 DIAGNOSIS — R001 Bradycardia, unspecified: Secondary | ICD-10-CM

## 2022-08-11 DIAGNOSIS — E669 Obesity, unspecified: Secondary | ICD-10-CM

## 2022-08-11 LAB — BASIC METABOLIC PANEL
Anion gap: 7 (ref 5–15)
BUN: 29 mg/dL — ABNORMAL HIGH (ref 8–23)
CO2: 26 mmol/L (ref 22–32)
Calcium: 9.6 mg/dL (ref 8.9–10.3)
Chloride: 106 mmol/L (ref 98–111)
Creatinine, Ser: 1.22 mg/dL — ABNORMAL HIGH (ref 0.44–1.00)
GFR, Estimated: 42 mL/min — ABNORMAL LOW (ref >60)
Glucose, Bld: 153 mg/dL — ABNORMAL HIGH (ref 70–99)
Potassium: 4 mmol/L (ref 3.5–5.1)
Sodium: 139 mmol/L (ref 135–145)

## 2022-08-11 LAB — CBC
HCT: 35.2 % — ABNORMAL LOW (ref 36.0–46.0)
Hemoglobin: 11.6 g/dL — ABNORMAL LOW (ref 12.0–15.0)
MCH: 30.1 pg (ref 26.0–34.0)
MCHC: 33 g/dL (ref 30.0–36.0)
MCV: 91.2 fL (ref 80.0–100.0)
Platelets: 222 10*3/uL (ref 150–400)
RBC: 3.86 MIL/uL — ABNORMAL LOW (ref 3.87–5.11)
RDW: 13.2 % (ref 11.5–15.5)
WBC: 6.5 10*3/uL (ref 4.0–10.5)
nRBC: 0 % (ref 0.0–0.2)

## 2022-08-11 LAB — TROPONIN I (HIGH SENSITIVITY)
Troponin I (High Sensitivity): 5 ng/L (ref <18)
Troponin I (High Sensitivity): 5 ng/L (ref <18)

## 2022-08-11 LAB — PROTIME-INR
INR: 0.9 (ref 0.8–1.2)
Prothrombin Time: 12.5 seconds (ref 11.4–15.2)

## 2022-08-11 MED ORDER — METOPROLOL TARTRATE 25 MG PO TABS
12.5000 mg | ORAL_TABLET | Freq: Two times a day (BID) | ORAL | Status: DC
  Administered 2022-08-11: 12.5 mg via ORAL
  Filled 2022-08-11 (×2): qty 1

## 2022-08-11 MED ORDER — LEVOTHYROXINE SODIUM 75 MCG PO TABS
75.0000 ug | ORAL_TABLET | Freq: Every day | ORAL | Status: DC
  Administered 2022-08-12 – 2022-08-14 (×3): 75 ug via ORAL
  Filled 2022-08-11 (×3): qty 1

## 2022-08-11 MED ORDER — ROSUVASTATIN CALCIUM 10 MG PO TABS
10.0000 mg | ORAL_TABLET | Freq: Every day | ORAL | Status: DC
  Administered 2022-08-12 – 2022-08-14 (×3): 10 mg via ORAL
  Filled 2022-08-11 (×3): qty 1

## 2022-08-11 MED ORDER — PANTOPRAZOLE SODIUM 40 MG PO TBEC
40.0000 mg | DELAYED_RELEASE_TABLET | Freq: Every day | ORAL | Status: DC
  Administered 2022-08-12 – 2022-08-14 (×3): 40 mg via ORAL
  Filled 2022-08-11 (×3): qty 1

## 2022-08-11 MED ORDER — IRBESARTAN 150 MG PO TABS
300.0000 mg | ORAL_TABLET | Freq: Every day | ORAL | Status: DC
  Administered 2022-08-12 – 2022-08-14 (×3): 300 mg via ORAL
  Filled 2022-08-11 (×3): qty 2

## 2022-08-11 MED ORDER — NITROGLYCERIN 0.4 MG SL SUBL
0.4000 mg | SUBLINGUAL_TABLET | Freq: Once | SUBLINGUAL | Status: AC
  Administered 2022-08-11: 0.4 mg via SUBLINGUAL

## 2022-08-11 MED ORDER — NITROGLYCERIN 0.4 MG SL SUBL
0.4000 mg | SUBLINGUAL_TABLET | Freq: Once | SUBLINGUAL | Status: AC
  Administered 2022-08-11: 0.4 mg via SUBLINGUAL
  Filled 2022-08-11: qty 1

## 2022-08-11 MED ORDER — ONDANSETRON HCL 4 MG/2ML IJ SOLN: 4.0000 mg | Freq: Four times a day (QID) | INTRAMUSCULAR | Status: DC | PRN

## 2022-08-11 MED ORDER — ASPIRIN 81 MG PO CHEW
324.0000 mg | CHEWABLE_TABLET | Freq: Once | ORAL | Status: AC
  Administered 2022-08-11: 324 mg via ORAL
  Filled 2022-08-11: qty 4

## 2022-08-11 MED ORDER — ACETAMINOPHEN 650 MG RE SUPP: 650.0000 mg | Freq: Four times a day (QID) | RECTAL | Status: DC | PRN

## 2022-08-11 MED ORDER — HYDROCHLOROTHIAZIDE 25 MG PO TABS
25.0000 mg | ORAL_TABLET | Freq: Every day | ORAL | Status: DC
  Filled 2022-08-11: qty 1

## 2022-08-11 MED ORDER — VALSARTAN-HYDROCHLOROTHIAZIDE 320-25 MG PO TABS: 1.0000 | ORAL_TABLET | Freq: Every day | ORAL | Status: DC

## 2022-08-11 MED ORDER — CLONIDINE HCL 0.1 MG PO TABS
0.1000 mg | ORAL_TABLET | Freq: Two times a day (BID) | ORAL | Status: DC
  Administered 2022-08-11: 0.1 mg via ORAL
  Filled 2022-08-11 (×2): qty 1

## 2022-08-11 MED ORDER — POLYETHYLENE GLYCOL 3350 17 G PO PACK: 17.0000 g | PACK | Freq: Every day | ORAL | Status: DC | PRN

## 2022-08-11 MED ORDER — ENOXAPARIN SODIUM 40 MG/0.4ML IJ SOSY
40.0000 mg | PREFILLED_SYRINGE | INTRAMUSCULAR | Status: DC
  Administered 2022-08-12 – 2022-08-14 (×3): 40 mg via SUBCUTANEOUS
  Filled 2022-08-11 (×3): qty 0.4

## 2022-08-11 MED ORDER — ASPIRIN 81 MG PO CHEW
81.0000 mg | CHEWABLE_TABLET | Freq: Every day | ORAL | Status: DC
  Administered 2022-08-12 – 2022-08-14 (×3): 81 mg via ORAL
  Filled 2022-08-11 (×3): qty 1

## 2022-08-11 MED ORDER — ONDANSETRON HCL 4 MG PO TABS: 4.0000 mg | ORAL_TABLET | Freq: Four times a day (QID) | ORAL | Status: DC | PRN

## 2022-08-11 MED ORDER — ACETAMINOPHEN 325 MG PO TABS
650.0000 mg | ORAL_TABLET | Freq: Four times a day (QID) | ORAL | Status: DC | PRN
  Administered 2022-08-12 – 2022-08-13 (×2): 650 mg via ORAL
  Filled 2022-08-11 (×2): qty 2

## 2022-08-11 MED ORDER — NITROGLYCERIN 0.4 MG SL SUBL: 0.4000 mg | SUBLINGUAL_TABLET | SUBLINGUAL | Status: DC | PRN

## 2022-08-11 NOTE — Assessment & Plan Note (Signed)
Not on medication. - HgbA1c -CBGs fasting daily

## 2022-08-11 NOTE — H&P (Signed)
History and Physical    Maria Hagemanrene S [place] ZOX:[id number] DOB: 1929/10/14 DOA: 08/11/2022  PCP: Kirstie PeriShah, Ashish, MD   Patient coming from: Home  I have personally briefly reviewed patient's old medical records [facility]  Chief Complaint:   HPI: Maria Durham is a 88 y.o. female with medical history significant for hypertension, coronary artery disease, diabetes mellitus. Patient presented to the ED with complaints of chest pain that started 3 days ago.  She describes chest pain as pressure-like, and nonradiating.  Chest pain was initially intermittent.  Denies chest pains with daily activity, no known relieving factors.  Reports chest pain was associated with difficulty catching her breath today.  Otherwise no nausea no vomiting no dizziness.  No leg swelling.  Today chest pain woke up in the morning, and has been present for most of the day and improved after 2 nitro was given in ED She has had a [facility], she is unable to tell if this pain is similar to that.  ED Course: Blood pressure 120s to 160s.  Heart rate 60s to 70s. Troponin 5 > 5.  Chest x-ray clear.  EKG without use, shows old left bundle branch block. Hospitalist to admit for chest pain.  Review of Systems: As per HPI all other systems reviewed and negative.  Past Medical History:  Diagnosis Date   Coronary artery disease    DES to proximal RCA March 2014 [facility]   Essential hypertension    Obesity    BMI 35.4   Type 2 diabetes mellitus ([facility])     Past Surgical History:  Procedure Laterality Date   CARDIAC CATHETERIZATION  2014   with stent placement    CHOLECYSTECTOMY       reports that she has never smoked. She has never used smokeless tobacco. She reports that she does not drink alcohol and does not use drugs.  Allergies  Allergen Reactions   Fish Oil Hives   Oxycontin [Oxycodone Hcl] Other (See Comments)    "made me crazy"    Family History  Problem  Relation Age of Onset   Diabetes Mother    Coronary artery disease Other    Prior to Admission medications   Medication Sig Start Date End Date Taking? Authorizing Provider  aspirin 81 MG tablet Take 81 mg by mouth daily.    [provider]  levothyroxine (SYNTHROID) 75 MCG tablet Take 75 mcg by mouth daily before breakfast.    [provider]  metoprolol tartrate (LOPRESSOR) 25 MG tablet TAKE 1/2 TABLET BY MOUTH TWICE DAILY. 12/22/20   Jonelle SidleMcDowell, Samuel G, MD  Multiple Vitamins-Minerals (MULTIVITAMIN PO) Take by mouth.    [provider]  Multiple Vitamins-Minerals (PRESERVISION AREDS 2+MULTI VIT PO) Take 1 tablet by mouth daily.    [provider]  nitroGLYCERIN (NITROSTAT) 0.4 MG SL tablet Place 1 tablet (0.4 mg total) under the tongue every 5 (five) minutes x 3 doses as needed (if no relief after 2nd dose, proceed to ED for evaluation). Reported on 01/07/2016 11/11/21   Jonelle SidleMcDowell, Samuel G, MD  pantoprazole (PROTONIX) 40 MG tablet Take 40 mg by mouth daily.    [provider]  rosuvastatin (CRESTOR) 10 MG tablet Take 1 tablet (10 mg total) by mouth daily. 11/11/21   Jonelle SidleMcDowell, Samuel G, MD  valsartan-hydrochlorothiazide (DIOVAN-HCT) 320-25 MG tablet TAKE 1 TABLET BY MOUTH ONCE A DAY. 03/30/22   Jonelle SidleMcDowell, Samuel G, MD  vitamin C (ASCORBIC ACID) 500 MG tablet Take 500 mg by mouth daily.    [provider]    Physical Exam: Vitals:   08/11/22 1530 08/11/22 1700 08/11/22 1730 08/11/22 1743  BP: (!) 146/59 (!) 168/60 (!) 140/72 125/68  Pulse: 67 62 68 78  Resp: 17 17 17  (!) 22  SpO2: 95% 98% 94% 97%    Constitutional: NAD, calm, comfortable Vitals:   08/11/22 1530 08/11/22 1700 08/11/22 1730 08/11/22 1743  BP: (!) 146/59 (!) 168/60 (!) 140/72 125/68  Pulse: 67 62 68 78  Resp: 17 17 17  (!) 22  SpO2: 95% 98% 94% 97%   Eyes: PERRL, lids and conjunctivae normal ENMT: Mucous membranes are moist.   Neck: normal, supple, no masses, no  thyromegaly Respiratory: clear to auscultation bilaterally, no wheezing, no crackles. Normal respiratory effort. No accessory muscle use.  Cardiovascular: Regular rate and rhythm, no murmurs / rubs / gallops. No extremity edema.  Extremities warm. 2+ pedal pulses. No carotid bruits.  Abdomen: no tenderness, no masses palpated. No hepatosplenomegaly. Bowel sounds positive.  Musculoskeletal: no clubbing / cyanosis. No joint deformity upper and lower extremities. Good ROM, no contractures. Normal muscle tone.  Skin: no rashes, lesions, ulcers. No induration Neurologic: Speech fluent without evidence of aphasia, no facial asymmetry, moving extremities spontaneously.Marland Kitchen  Psychiatric: Normal judgment and insight. Alert and oriented x 3. Normal mood.   Labs on Admission: I have personally reviewed following labs and imaging studies  CBC: Recent Labs  Lab 08/11/22 1441  WBC 6.5  HGB 11.6*  HCT 35.2*  MCV 91.2  PLT [id number]   Basic Metabolic Panel: Recent Labs  Lab 08/11/22 1441  NA 139  K 4.0  CL 106  CO2 26  GLUCOSE 153*  BUN 29*  CREATININE 1.22*  CALCIUM 9.6    Coagulation Profile: Recent Labs  Lab 08/11/22 1441  INR 0.9   Radiological Exams on Admission: DG Chest Port 1 View  Result Date: 08/11/2022 CLINICAL DATA:  Chest pain.  Chest heaviness, shortness of breath. EXAM: PORTABLE CHEST 1 VIEW COMPARISON:  Radiograph 11/10/2020 FINDINGS: Mild cardiomegaly. Stable mediastinal contours with aortic atherosclerosis. No focal airspace disease, pulmonary edema, pleural effusion or pneumothorax. Chronic left shoulder arthropathy. IMPRESSION: Unchanged mild cardiomegaly. No acute chest findings. Electronically Signed   By: Keith Rake M.D.   On: 08/11/2022 15:07    EKG: Independently reviewed.  Sinus rhythm, rate 73, QTc 479.  First-degree AV block with PR intervals 227, and LBBB.  Both abnormalities are old.  Assessment/Plan Principal Problem:   Chest pain Active Problems:    Essential hypertension, benign   Diabetes ([place])   Assessment and Plan: * Chest pain Chest pain improved with nitro.  High risk for ACS with prior PCI with stents in 2014 at [facility].  Troponin 5x2.  EKG with old LBBB and first-degree heart block. -Per care everywhere, 03/2013- diagnostic cardiac catheterization and was found to have a high-grade 95% calcified eccentric stenosis of the proximal RCA, with calcified shelf of ostium. The patient was treated with a drug-eluting stent. -N.p.o. midnight - Cardiology to see in a.m. - Aspirin 324 mg daily, continue home 81 mg daily -Resume metoprolol, statins  Diabetes ([place]) Not on medication. - HgbA1c -CBGs fasting daily  Essential hypertension, benign Blood Pressure 120s to 160s. -Resume metoprolol, valsartan HCTZ- 320-25mg    DVT prophylaxis: Lovenox Code Status: Full-confirmed with patient at bedside Family Communication: None at bedside. Disposition Plan: ~ 1 - 2 days Consults called:  None  Admission status:  obs tele    Author: Bethena Roys, MD 08/11/2022 10:29 PM  For on call review [email or web address].

## 2022-08-11 NOTE — ED Notes (Signed)
Pt reports distinct fatigue for the past 2 days, frequent yawning, and mild weakness.

## 2022-08-11 NOTE — ED Notes (Signed)
2nd NTG SL administered. 1st had no effect. Pt reported no change in her chest discomfort and no headache associated with NTG.

## 2022-08-11 NOTE — ED Triage Notes (Signed)
Pt c/o chest pain that started x 3 days ago; pt's BP has been running high and pt's daughter called her PCP to get her meds adjusted but pt continues to have chest pain  Pt has hx of placement of cardiac stents

## 2022-08-11 NOTE — Assessment & Plan Note (Signed)
Blood Pressure 120s to 160s. -Resume metoprolol, valsartan HCTZ- 320-25mg 

## 2022-08-11 NOTE — Assessment & Plan Note (Addendum)
Chest pain improved with nitro.  High risk for ACS with prior PCI with stents in 2014 at [facility].  Troponin 5x2.  EKG with old LBBB and first-degree heart block. -Per care everywhere, 03/2013- diagnostic cardiac catheterization and was found to have a high-grade 95% calcified eccentric stenosis of the proximal RCA, with calcified shelf of ostium. The patient was treated with a drug-eluting stent. -N.p.o. midnight - Cardiology to see in a.m. - Aspirin 324 mg daily, continue home 81 mg daily -Resume metoprolol, statins

## 2022-08-11 NOTE — ED Provider Notes (Signed)
[facility] EMERGENCY DEPARTMENT Provider Note   CSN: [id number] Arrival date & time: 08/11/22  1400     History  Chief Complaint  Patient presents with   Chest Pain    Maria Durham is a 88 y.o. female with a history of CAD, history of stent to the proximal RCA in 2014 at [facility], more recently followed by Dr. Diona Browner, also history of hypertension and type 2 diabetes presenting for evaluation of chest pain which has been intermittent for the past 3 days, then became persistent since this morning after waking.  She describes a midsternal pressure sensation which has been as described initially intermittent and fleeting, but more persistent today.  She denies shortness of breath, palpitations, diaphoresis, did have some mild queasiness prior to arrival which has since resolved.  She is typically compliant with her medications but endorses had not taken her blood pressure medicine this morning, nor is she taking her aspirin.  She has found no alleviators for her symptoms, but does intensify with exertion and is better at rest.  Daughter at bedside states that her blood pressure has been elevated over the past week, her PCP has added clonidine 0.1 mg twice daily as needed blood pressures greater than 140/90, she has taken several doses of this new medicine.  She does have nitroglycerin at home but did not take this medication.  Per below, she had a low risk lexicon Myoview in 2017, there was a lateral defect suggesting a mild region of ischemia which has been medically managed.   Echocardiogram 01/05/2016 [facility]): LVEF 60 to 65% with grade 1 diastolic dysfunction, normal RV contraction, mild left atrial enlargement, mild mitral regurgitation with mild annular calcification, sclerotic aortic valve without stenosis, mild tricuspid regurgitation.   Lexiscan Myoview 01/19/2016: Left bundle-branch block present throughout study. Small, mild intensity apical anteroseptal  defect that is fixed and most consistent with soft tissue attenuation. Small, mild intensity, mid lateral defect is partially reversible and suggestive of a mild region of ischemia. This is a low risk study. Nuclear stress EF: 69%.   The history is provided by the patient.       Home Medications Prior to Admission medications   Medication Sig Start Date End Date Taking? Authorizing Provider  aspirin 81 MG tablet Take 81 mg by mouth daily.    [provider]  levothyroxine (SYNTHROID) 75 MCG tablet Take 75 mcg by mouth daily before breakfast.    [provider]  metoprolol tartrate (LOPRESSOR) 25 MG tablet TAKE 1/2 TABLET BY MOUTH TWICE DAILY. 12/22/20   Jonelle Sidle, MD  Multiple Vitamins-Minerals (MULTIVITAMIN PO) Take by mouth.    [provider]  Multiple Vitamins-Minerals (PRESERVISION AREDS 2+MULTI VIT PO) Take 1 tablet by mouth daily.    [provider]  nitroGLYCERIN (NITROSTAT) 0.4 MG SL tablet Place 1 tablet (0.4 mg total) under the tongue every 5 (five) minutes x 3 doses as needed (if no relief after 2nd dose, proceed to ED for evaluation). Reported on 01/07/2016 11/11/21   Jonelle Sidle, MD  pantoprazole (PROTONIX) 40 MG tablet Take 40 mg by mouth daily.    [provider]  rosuvastatin (CRESTOR) 10 MG tablet Take 1 tablet (10 mg total) by mouth daily. 11/11/21   Jonelle Sidle, MD  valsartan-hydrochlorothiazide (DIOVAN-HCT) 320-25 MG tablet TAKE 1 TABLET BY MOUTH ONCE A DAY. 03/30/22   Jonelle Sidle, MD  vitamin C (ASCORBIC ACID) 500 MG tablet Take 500 mg  by mouth daily.    [provider]      Allergies    Fish oil and Oxycontin [oxycodone hcl]    Review of Systems   Review of Systems  Constitutional:  Negative for diaphoresis and fever.  HENT:  Negative for congestion and sore throat.   Eyes: Negative.   Respiratory:  Negative for chest tightness and shortness of breath.   Cardiovascular:  Positive  for chest pain. Negative for palpitations and leg swelling.  Gastrointestinal:  Positive for nausea. Negative for abdominal pain.  Genitourinary: Negative.   Musculoskeletal:  Negative for arthralgias, joint swelling and neck pain.  Skin: Negative.  Negative for rash and wound.  Neurological:  Negative for dizziness, weakness, light-headedness, numbness and headaches.  Psychiatric/Behavioral: Negative.    All other systems reviewed and are negative.   Physical Exam Updated Vital Signs BP 125/68   Pulse 78   Resp (!) 22   SpO2 97%  Physical Exam Vitals and nursing note reviewed.  Constitutional:      Appearance: She is well-developed.  HENT:     Head: Normocephalic and atraumatic.  Eyes:     Conjunctiva/sclera: Conjunctivae normal.  Cardiovascular:     Rate and Rhythm: Normal rate and regular rhythm.     Heart sounds: Murmur heard.     Systolic murmur is present with a grade of 2/6.  Pulmonary:     Effort: Pulmonary effort is normal. No respiratory distress.     Breath sounds: Normal breath sounds. No stridor. No decreased breath sounds, wheezing or rhonchi.  Abdominal:     General: Bowel sounds are normal.     Palpations: Abdomen is soft.     Tenderness: There is no abdominal tenderness.  Musculoskeletal:        General: Normal range of motion.     Cervical back: Normal range of motion.     Right lower leg: No edema.     Left lower leg: No edema.  Skin:    General: Skin is warm and dry.  Neurological:     Mental Status: She is alert.     ED Results / Procedures / Treatments   Labs (all labs ordered are listed, but only abnormal results are displayed) Labs Reviewed  BASIC METABOLIC PANEL - Abnormal; Notable for the following components:      Result Value   Glucose, Bld 153 (*)    BUN 29 (*)    Creatinine, Ser 1.22 (*)    GFR, Estimated 42 (*)    All other components within normal limits  CBC - Abnormal; Notable for the following components:   RBC 3.86 (*)     Hemoglobin 11.6 (*)    HCT 35.2 (*)    All other components within normal limits  PROTIME-INR  TROPONIN I (HIGH SENSITIVITY)  TROPONIN I (HIGH SENSITIVITY)    EKG None ED ECG REPORT   Date: 08/11/2022  Rate: 73  Rhythm: normal sinus rhythm  QRS Axis: left  Intervals: normal  ST/T Wave abnormalities: normal  Conduction Disutrbances:left bundle branch block  Narrative Interpretation:   Old EKG Reviewed: unchanged  I have personally reviewed the EKG tracing and agree with the computerized printout as noted.  Radiology DG Chest Port 1 View  Result Date: 08/11/2022 CLINICAL DATA:  Chest pain.  Chest heaviness, shortness of breath. EXAM: PORTABLE CHEST 1 VIEW COMPARISON:  Radiograph 11/10/2020 FINDINGS: Mild cardiomegaly. Stable mediastinal contours with aortic atherosclerosis. No focal airspace disease, pulmonary edema, pleural effusion or  pneumothorax. Chronic left shoulder arthropathy. IMPRESSION: Unchanged mild cardiomegaly. No acute chest findings. Electronically Signed   By: Keith Rake M.D.   On: 08/11/2022 15:07    Procedures Procedures    Medications Ordered in ED Medications  aspirin chewable tablet 324 mg (324 mg Oral Given 08/11/22 1551)  nitroGLYCERIN (NITROSTAT) SL tablet 0.4 mg (0.4 mg Sublingual Given 08/11/22 1728)  nitroGLYCERIN (NITROSTAT) SL tablet 0.4 mg (0.4 mg Sublingual Given 08/11/22 1738)    ED Course/ Medical Decision Making/ A&P                           Medical Decision Making Patient with a history of CAD, presenting with a 3-day history of chest pressure intermittent over the past 2 days and more constant today, concerning for possible new unstable angina.  Associated with some mild nausea, denies diaphoresis, shortness of breath.  She has had negative troponins here.  Her EKG is stable, left bundle branch block, left axis deviation unchanged.  She was given 2 sublingual nitroglycerin tablets after which her chest pressure resolved.  Her blood  pressure tolerated these medications.  Her last provocative study was a Myoview in 2017 which did show some probable mild disease.  Patient would benefit from admission for formal cardiology consult and consideration for repeat provocative studies.  Patient is agreeable to admission.  Amount and/or Complexity of Data Reviewed Labs: ordered.    Details: Delta troponins are negative, she does have a mild kidney disease with a creatinine of 1.22.  Mild anemia with hemoglobin 11.6. Radiology: ordered and independent interpretation performed.    Details: Mild cardiomegaly. ECG/medicine tests: ordered.  Risk OTC drugs. Prescription drug management.           Final Clinical Impression(s) / ED Diagnoses Final diagnoses:  Chest pain, unspecified type    Rx / DC Orders ED Discharge Orders     None         Landis Martins 08/11/22 1827    Fredia Sorrow, MD 08/13/22 [phone]

## 2022-08-12 ENCOUNTER — Observation Stay (HOSPITAL_BASED_OUTPATIENT_CLINIC_OR_DEPARTMENT_OTHER): Payer: Medicare Other

## 2022-08-12 DIAGNOSIS — R079 Chest pain, unspecified: Secondary | ICD-10-CM | POA: Diagnosis not present

## 2022-08-12 DIAGNOSIS — I10 Essential (primary) hypertension: Secondary | ICD-10-CM | POA: Diagnosis not present

## 2022-08-12 DIAGNOSIS — E119 Type 2 diabetes mellitus without complications: Secondary | ICD-10-CM | POA: Diagnosis not present

## 2022-08-12 LAB — CBC
HCT: 31.6 % — ABNORMAL LOW (ref 36.0–46.0)
Hemoglobin: 10.4 g/dL — ABNORMAL LOW (ref 12.0–15.0)
MCH: 30.2 pg (ref 26.0–34.0)
MCHC: 32.9 g/dL (ref 30.0–36.0)
MCV: 91.9 fL (ref 80.0–100.0)
Platelets: 200 10*3/uL (ref 150–400)
RBC: 3.44 MIL/uL — ABNORMAL LOW (ref 3.87–5.11)
RDW: 13.2 % (ref 11.5–15.5)
WBC: 5.4 10*3/uL (ref 4.0–10.5)
nRBC: 0 % (ref 0.0–0.2)

## 2022-08-12 LAB — ECHOCARDIOGRAM COMPLETE
AR max vel: 2.46 cm2
AV Area VTI: 2.32 cm2
AV Area mean vel: 2.33 cm2
AV Mean grad: 9 mmHg
AV Peak grad: 14.9 mmHg
Ao pk vel: 1.93 m/s
Area-P 1/2: 3.4 cm2
Height: 62 in
MV VTI: 4.39 cm2
S' Lateral: 3.1 cm
Single Plane A4C EF: 60.3 %
Weight: 3248.7 oz

## 2022-08-12 LAB — BASIC METABOLIC PANEL
Anion gap: 7 (ref 5–15)
BUN: 25 mg/dL — ABNORMAL HIGH (ref 8–23)
CO2: 26 mmol/L (ref 22–32)
Calcium: 9.2 mg/dL (ref 8.9–10.3)
Chloride: 106 mmol/L (ref 98–111)
Creatinine, Ser: 1.07 mg/dL — ABNORMAL HIGH (ref 0.44–1.00)
GFR, Estimated: 49 mL/min — ABNORMAL LOW (ref >60)
Glucose, Bld: 138 mg/dL — ABNORMAL HIGH (ref 70–99)
Potassium: 3.7 mmol/L (ref 3.5–5.1)
Sodium: 139 mmol/L (ref 135–145)

## 2022-08-12 MED ORDER — AMLODIPINE BESYLATE 5 MG PO TABS
2.5000 mg | ORAL_TABLET | Freq: Every day | ORAL | Status: DC
  Administered 2022-08-12: 2.5 mg via ORAL
  Filled 2022-08-12 (×2): qty 1

## 2022-08-12 MED ORDER — PERFLUTREN LIPID MICROSPHERE
1.0000 mL | INTRAVENOUS | Status: AC | PRN
  Administered 2022-08-12: 5 mL via INTRAVENOUS

## 2022-08-12 MED ORDER — ISOSORBIDE MONONITRATE ER 60 MG PO TB24
30.0000 mg | ORAL_TABLET | Freq: Every day | ORAL | Status: DC
  Administered 2022-08-12 – 2022-08-14 (×3): 30 mg via ORAL
  Filled 2022-08-12 (×3): qty 1

## 2022-08-12 NOTE — Care Management Obs Status (Signed)
[facility] NOTIFICATION   Patient Details  Name: Maria Durham MRN: [id number] Date of Birth: 23-Sep-1929   Medicare Observation Status Notification Given:  Yes    Tommy Medal 08/12/2022, 4:20 PM

## 2022-08-12 NOTE — Progress Notes (Signed)
Patient slept thorough the night with no complaints of pain. Patient has been NPO since midnight with exception on a sip of water to take morning Levothyroxine, this was approved by Dr. Josephine Cables.

## 2022-08-12 NOTE — Progress Notes (Signed)
PROGRESS NOTE    TASHIRA TORRE  NOB:[id number] DOB: 1929/07/05 DOA: 08/11/2022 PCP: Monico Blitz, MD   Brief Narrative:  HPI per DR. Ejiroghene Emokpae on 08/11/22  Maria Durham is a 88 y.o. female with medical history significant for hypertension, coronary artery disease, diabetes mellitus. Patient presented to the ED with complaints of chest pain that started 3 days ago.  She describes chest pain as pressure-like, and nonradiating.  Chest pain was initially intermittent.  Denies chest pains with daily activity, no known relieving factors.  Reports chest pain was associated with difficulty catching her breath today.  Otherwise no nausea no vomiting no dizziness.  No leg swelling.  Today chest pain woke up in the morning, and has been present for most of the day and improved after 2 nitro was given in ED She has had a stents placed in her heart in 2014 at  [facility], she is unable to tell if this pain is similar to that.   ED Course: Blood pressure 120s to 160s.  Heart rate 60s to 70s. Troponin 5 > 5.  Chest x-ray clear.  EKG without use, shows old left bundle branch block. Hospitalist to admit for chest pain.   **Interim History  Cardiology was consulted for the patient's chest pain and recommended obtaining echocardiogram.  Her beta-blocker was held due to her Colen Darling and sinus bradycardia.  Assessment and Plan: * Chest pain rule out ACS -Chest pain improved with nitro.   -High risk for ACS with prior PCI with stents in 2014 at [facility].   -Troponin 5x2.   -EKG with old LBBB and first-degree heart block. -Per care everywhere, 03/2013- diagnostic cardiac catheterization and was found to have a high-grade 95% calcified eccentric stenosis of the proximal RCA, with calcified shelf of ostium. The patient was treated with a drug-eluting stent. -Currently NPO but will be given a diet getting a cardiology recommends holding further testing for now and ambulating this  afternoon -Cardiology has repeated an echocardiogram which is pending read -Received Aspirin 324 mg once, continue home 81 mg daily -Takes Metoprolol Tartrate 12.5 mg po BID and was ordered last night but held and discontinued by cardio allergy given her bradycardia -C/w Rosuvastatin 10 mg po Daily -C/w NTG SL 0.4 mg q16min x3 PRN -Further care per cardiology and she has been started on Imdur; cardiology recommending also ambulating for now and working on getting blood pressure under better control -Cardiology feels that she is not an ideal candidate for Lexiscan Myoview this morning given her possible episodes of second-degree type II block and they are currently awaiting echo read  Sinus bradycardia with first-degree AV block with intermittent type I second-degree AV block -Her beta-blocker has been discontinued and cardiology allowing for washout -Echocardiogram being repeated  Diabetes ([place]) -Not on medication. -HgbA1c still pending  -CBGs fasting daily ranging from 138-153  Essential hypertension, benign -Blood Pressure 120s to 160s. -See above about Metoprolol being now discontinued given her bradycardia -Valsartan 300 mg po Daily being substituted by hospital Irbesartan 300 mg po Daily -C/w Clonidine 0.1 mg po BID  -Initiated on Imdur 30 mg p.o. daily -Will D/C HCTZ for now in case she goes for Cath -Per cardiology for blood pressure remains elevated that we can add amlodipine  CKD Stage 3a -Patient's BUN/Cr improved from admission and is now 25/1.07 -Avoid further Nephrotoxic Medications, Contrast Dyes, Hypotension to ensure adequate Renal perfusion -Will need to Renally adjust medications -Repeat CMP in the  AM   HLD -C/w Rosuvastatin 10 mg po Daily  -Cardiology is checking a fasting lipid panel in the morning  Hypothyroidism -C/w Levothyroxine 75 mcg po Daily  Normocytic Anemia -Patient's Hgb/Hct went from 11.6/35.2 -> 10.4/31.6 -Check Anemia Panel in the  AM -Continue to Monitor for S/Sx of Bleeding; No overt bleeding noted -Repeat CBC in the AM   GERD/GI Prophylaxis -C/w Pantoprazole 40 mg po Daily  Obesity -Complicates overall prognosis and care -Estimated body mass index is 37.14 kg/m as calculated from the following:   Height as of this encounter: 5\' 2"  (1.575 m).   Weight as of this encounter: 92.1 kg.  -Weight Loss and Dietary Counseling given  DVT prophylaxis: enoxaparin (LOVENOX) injection 40 mg Start: 08/12/22 1000    Code Status: Full Code Family Communication: Discussed with family at bedside  Disposition Plan:  Level of care: Telemetry Status is: Observation The patient remains OBS appropriate and will d/c before 2 midnights.    Consultants:  Cardiology   Procedures:  Echocardiogram which is done and pending read  Antimicrobials:  Anti-infectives (From admission, onward)    None       Subjective: Seen and examined at bedside and states that she has having chest discomfort and feels like a chest "pressure and somebody sitting on her chest with a heaviness".  She denies any nausea or vomiting.  Feels okay currently.  Denies any lightheadedness or dizziness.  No other concerns or complaints at this time.  Objective: Vitals:   08/11/22 2021 08/11/22 2239 08/12/22 0115 08/12/22 0524  BP: (!) 160/61 (!) 160/61 (!) 149/62 (!) 147/43  Pulse: 70 70 68 63  Resp: 20 20 18 18   Temp: 97.9 F (36.6 C) 97.9 F (36.6 C) 97.8 F (36.6 C) 98.3 F (36.8 C)  TempSrc: Oral Oral Oral Oral  SpO2:   99% 96%  Weight:  92.1 kg    Height:  5\' 2"  (1.575 m)      Intake/Output Summary (Last 24 hours) at 08/12/2022 [id number] Last data filed at 08/11/2022 2000 Gross per 24 hour  Intake 240 ml  Output --  Net 240 ml   Filed Weights   08/11/22 2008 08/11/22 2239  Weight: 92.1 kg 92.1 kg   Examination: Physical Exam:  Constitutional: WN/WD elderly obese Caucasian female currently no acute distress getting her  echocardiogram Respiratory: Diminished to auscultation bilaterally, no wheezing, rales, rhonchi or crackles. Normal respiratory effort and patient is not tachypenic. No accessory muscle use.  Cardiovascular: RRR, has a 2 out of 6 systolic murmur. S1 and S2 auscultated. No appreciable extremity edema Abdomen: Soft, non-tender, distended secondary body habitus. Bowel sounds positive.  GU: Deferred. Musculoskeletal: No clubbing / cyanosis of digits/nails. No joint deformity upper and lower extremities. .  Skin: No rashes, lesions, ulcers on limited skin evaluation. No induration; Warm and dry.  Neurologic: CN 2-12 grossly intact with no focal deficits.  Romberg sign and cerebellar reflexes not assessed.  Psychiatric: Normal judgment and insight. Alert and oriented x 3. Normal mood and appropriate affect.   Data Reviewed: I have personally reviewed following labs and imaging studies  CBC: Recent Labs  Lab 08/11/22 1441 08/12/22 0457  WBC 6.5 5.4  HGB 11.6* 10.4*  HCT 35.2* 31.6*  MCV 91.2 91.9  PLT 222 [id number]   Basic Metabolic Panel: Recent Labs  Lab 08/11/22 1441 08/12/22 0457  NA 139 139  K 4.0 3.7  CL 106 106  CO2 26 26  GLUCOSE 153* 138*  BUN  29* 25*  CREATININE 1.22* 1.07*  CALCIUM 9.6 9.2   GFR: Estimated Creatinine Clearance: 36.2 mL/min (A) (by C-G formula based on SCr of 1.07 mg/dL (H)). Liver Function Tests: No results for input(s): "AST", "ALT", "ALKPHOS", "BILITOT", "PROT", "ALBUMIN" in the last 168 hours. No results for input(s): "LIPASE", "AMYLASE" in the last 168 hours. No results for input(s): "AMMONIA" in the last 168 hours. Coagulation Profile: Recent Labs  Lab 08/11/22 1441  INR 0.9   Cardiac Enzymes: No results for input(s): "CKTOTAL", "CKMB", "CKMBINDEX", "TROPONINI" in the last 168 hours. BNP (last 3 results) No results for input(s): "PROBNP" in the last 8760 hours. HbA1C: No results for input(s): "HGBA1C" in the last 72 hours. CBG: No results  for input(s): "GLUCAP" in the last 168 hours. Lipid Profile: No results for input(s): "CHOL", "HDL", "LDLCALC", "TRIG", "CHOLHDL", "LDLDIRECT" in the last 72 hours. Thyroid Function Tests: No results for input(s): "TSH", "T4TOTAL", "FREET4", "T3FREE", "THYROIDAB" in the last 72 hours. Anemia Panel: No results for input(s): "VITAMINB12", "FOLATE", "FERRITIN", "TIBC", "IRON", "RETICCTPCT" in the last 72 hours. Sepsis Labs: No results for input(s): "PROCALCITON", "LATICACIDVEN" in the last 168 hours.  No results found for this or any previous visit (from the past 240 hour(s)).   Radiology Studies: DG Chest Port 1 View  Result Date: 08/11/2022 CLINICAL DATA:  Chest pain.  Chest heaviness, shortness of breath. EXAM: PORTABLE CHEST 1 VIEW COMPARISON:  Radiograph 11/10/2020 FINDINGS: Mild cardiomegaly. Stable mediastinal contours with aortic atherosclerosis. No focal airspace disease, pulmonary edema, pleural effusion or pneumothorax. Chronic left shoulder arthropathy. IMPRESSION: Unchanged mild cardiomegaly. No acute chest findings. Electronically Signed   By: Keith Rake M.D.   On: 08/11/2022 15:07    Scheduled Meds:  aspirin  81 mg Oral Daily   cloNIDine  0.1 mg Oral BID   enoxaparin (LOVENOX) injection  40 mg Subcutaneous Q24H   irbesartan  300 mg Oral Daily   And   hydrochlorothiazide  25 mg Oral Daily   levothyroxine  75 mcg Oral Q0600   pantoprazole  40 mg Oral Daily   rosuvastatin  10 mg Oral Daily   Continuous Infusions:   LOS: 0 days   Raiford Noble, DO [facility] Available via Epic secure chat 7am-7pm After these hours, please refer to coverage provider listed on [email or web address] 08/12/2022, 8:26 AM

## 2022-08-12 NOTE — TOC Progression Note (Signed)
  Transition of Care [facility]) Screening Note   Patient Details  Name: JERELINE TICER Date of Birth: [birth year]   Transition of Care [facility]) CM/SW Contact:    Shade Flood, LCSW Phone Number: 08/12/2022, 10:54 AM    Transition of Care Department [facility]) has reviewed patient and no TOC needs have been identified at this time. We will continue to monitor patient advancement through interdisciplinary progression rounds. If new patient transition needs arise, please place a TOC consult.

## 2022-08-12 NOTE — Consult Note (Addendum)
Cardiology Consultation   Patient ID: Maria Hagemanrene S Adsit MRN: [id number]; DOB: Jan 01, 1929  Admit date: 08/11/2022 Date of Consult: 08/12/2022  PCP:  Kirstie PeriShah, Ashish, MD   [facility] Providers Cardiologist:  Nona DellSamuel McDowell, MD        Patient Profile:   Maria Durham is a 88 y.o. female with a hx of CAD (s/p DES to proximal RCA in 2014, NST in 2017 showing an area of mild ischemia but overall low-risk and medically managed), HTN, HLD and Hypothyroidism who is being seen 08/12/2022 for the evaluation of chest pain at the request of Dr. Mariea ClontsEmokpae.  History of Present Illness:   Ms. Evonnie DawesDaves was last examined Dr. Diona BrownerMcDowell in 10/2021 and denied any recent chest pain or dyspnea on exertion at that time. She was continued on her current cardiac medications including ASA, Lopressor, Diovan and Crestor.  She presented to [facility] ED on 08/11/2022 for evaluation of elevated BP and intermittent chest pain for the past 3 days. In talking with the patient and her daughter-in-law today (former Engineer, civil (consulting)nurse), she reports her blood pressure has been elevated for the past week and she was recently started on PRN Clonidine by her PCP. She would take 25 mg of Lopressor instead of 12.5 mg at times and BP would improve with this. Over the past 3 to 4 days, she has been experiencing more fatigue, dyspnea on exertion and chest discomfort. She reports her chest pain is a tightness and this can occur at rest or with activity. Her daughter-in-law mentions the pain can last all day. There has been no association with positional changes or food consumption. No radiating pain into her neck or down her arms.  No recent orthopnea or PND. She does experience intermittent lower extremity edema which progresses throughout the day. She lives with her son and daughter-in-law and is not overly active at baseline. Still goes to church on Sundays and walks to the mailbox on occasion.   Initial labs show WBC 6.5, Hgb 11.6, platelets 222,  Na+ 139, K+ 4.0 and creatinine 1.22. Initial and repeat Hs Troponin values negative at 5. CXR with mild cardiomegaly but no acute changes. 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 On telemetry, she has been noted to have episodes of Wenckebach and has been bradycardic with heart rate in the 40's to 60's and pauses up to 2.5 seconds (occurring in the setting of non-conducted p-waves). Cannot rule-out occasional episodes of 2nd degree, Type 2 block.    Past Medical History:  Diagnosis Date   Coronary artery disease    DES to proximal RCA March 2014 [facility]   Essential hypertension    Obesity    BMI 35.4   Type 2 diabetes mellitus (HCC)     Past Surgical History:  Procedure Laterality Date   CARDIAC CATHETERIZATION  2014   with stent placement    CHOLECYSTECTOMY       Home Medications:  Prior to Admission medications   Medication Sig Start Date End Date Taking? Authorizing Provider  ascorbic acid (VITAMIN C) 500 MG tablet Take 500 mg by mouth daily.   Yes [provider]  aspirin 81 MG tablet Take 81 mg by mouth daily.   Yes [provider]  Cholecalciferol (VITAMIN D3) 125 MCG (5000 UT) CAPS Take by mouth.   Yes [provider]  cloNIDine (CATAPRES) 0.1 MG tablet Take 0.1 mg by mouth 2 (two) times daily. 08/06/22  Yes [provider]  levothyroxine (SYNTHROID) 75 MCG tablet Take 75 mcg by mouth daily before breakfast.   Yes [provider]  metoprolol tartrate (LOPRESSOR) 25 MG tablet TAKE 1/2 TABLET BY MOUTH TWICE DAILY. 12/22/20  Yes Jonelle Sidle, MD  Multiple Vitamins-Minerals (MULTIVITAMIN PO) Take by mouth.   Yes [provider]  Multiple Vitamins-Minerals (PRESERVISION AREDS 2+MULTI VIT PO) Take 1 tablet by mouth daily.   Yes [provider]  nitroGLYCERIN (NITROSTAT) 0.4 MG SL tablet Place 1 tablet (0.4 mg total) under the tongue every 5 (five) minutes x 3 doses as  needed (if no relief after 2nd dose, proceed to ED for evaluation). Reported on 01/07/2016 11/11/21  Yes Jonelle Sidle, MD  pantoprazole (PROTONIX) 40 MG tablet Take 40 mg by mouth daily.   Yes [provider]  rosuvastatin (CRESTOR) 10 MG tablet Take 1 tablet (10 mg total) by mouth daily. 11/11/21  Yes Jonelle Sidle, MD  valsartan-hydrochlorothiazide (DIOVAN-HCT) 320-25 MG tablet TAKE 1 TABLET BY MOUTH ONCE A DAY. 03/30/22  Yes Jonelle Sidle, MD  vitamin B-12 (CYANOCOBALAMIN) 100 MCG tablet Take 100 mcg by mouth daily.   Yes [provider]  vitamin C (ASCORBIC ACID) 500 MG tablet Take 500 mg by mouth daily.   Yes [provider]  Zinc 100 MG TABS Take 1 tablet by mouth daily.   Yes [provider]    Inpatient Medications: Scheduled Meds:  aspirin  81 mg Oral Daily   enoxaparin (LOVENOX) injection  40 mg Subcutaneous Q24H   irbesartan  300 mg Oral Daily   isosorbide mononitrate  30 mg Oral Daily   levothyroxine  75 mcg Oral Q0600   pantoprazole  40 mg Oral Daily   rosuvastatin  10 mg Oral Daily   Continuous Infusions:  PRN Meds: acetaminophen **OR** acetaminophen, nitroGLYCERIN, ondansetron **OR** ondansetron (ZOFRAN) IV, polyethylene glycol  Allergies:    Allergies  Allergen Reactions   Fish Oil Hives   Oxycontin [Oxycodone Hcl] Other (See Comments)    "made me crazy"    Social History:   Social History   Socioeconomic History   Marital status: Single    Spouse name: Not on file   Number of children: Not on file   Years of education: Not on file   Highest education level: Not on file  Occupational History   Not on file  Tobacco Use   Smoking status: Never   Smokeless tobacco: Never  Vaping Use   Vaping Use: Never used  Substance and Sexual Activity   Alcohol use: No    Alcohol/week: 0.0 standard drinks of alcohol   Drug use: No   Sexual activity: Not on file  Other Topics Concern   Not on file  Social History  Narrative   ** Merged History Encounter **       Social Determinants of Health   Financial Resource Strain: Not on file  Food Insecurity: No Food Insecurity (08/11/2022)   Hunger Vital Sign    Worried About Running Out of Food in the Last Year: Never true    Ran Out of Food in the Last Year: Never true  Transportation Needs: No Transportation Needs (08/11/2022)   PRAPARE - Administrator, Civil Service (Medical): No    Lack of Transportation (Non-Medical): No  Physical Activity: Not on file  Stress: Not on file  Social Connections: Not on file  Intimate Partner Violence: Not At Risk (08/11/2022)   Humiliation,  Afraid, Rape, and Kick questionnaire    Fear of Current or Ex-Partner: No    Emotionally Abused: No    Physically Abused: No    Sexually Abused: No    Family History:    Family History  Problem Relation Age of Onset   Diabetes Mother    Coronary artery disease Other      ROS:  Please see the history of present illness.   All other ROS reviewed and negative.     Physical Exam/Data:   Vitals:   08/11/22 2021 08/11/22 2239 08/12/22 0115 08/12/22 0524  BP: (!) 160/61 (!) 160/61 (!) 149/62 (!) 147/43  Pulse: 70 70 68 63  Resp: 20 20 18 18   Temp: 97.9 F (36.6 C) 97.9 F (36.6 C) 97.8 F (36.6 C) 98.3 F (36.8 C)  TempSrc: Oral Oral Oral Oral  SpO2:   99% 96%  Weight:  92.1 kg    Height:  5\' 2"  (1.575 m)      Intake/Output Summary (Last 24 hours) at 08/12/2022 0924 Last data filed at 08/11/2022 2000 Gross per 24 hour  Intake 240 ml  Output --  Net 240 ml      08/11/2022   10:39 PM 08/11/2022    8:08 PM 11/11/2021   12:48 PM  Last 3 Weights  Weight (lbs) 203 lb 0.7 oz 203 lb 0.7 oz 203 lb  Weight (kg) 92.1 kg 92.1 kg 92.08 kg     Body mass index is 37.14 kg/m.  General: Pleasant elderly female appearing in no acute distress. Appears younger than stated age.  HEENT: normal Neck: no JVD Vascular: No carotid bruits; Distal pulses 2+  bilaterally Cardiac:  normal S1, S2; Regular rhythm, bradycardiac rate.  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  Telemetry:  Telemetry was personally reviewed and demonstrates:  Sinus bradycardia with HR in the 40's to 60's. Episodes of Wenckebach and pauses up to 2.5 seconds (occurring in the setting of non-conducted p-waves). Cannot rule-out occasional episodes of 2nd degree, Type 2 block.   Relevant CV Studies:  Echocardiogram: 12/2015   NST: 12/2015 Left bundle-branch block present throughout study. Small, mild intensity apical anteroseptal defect that is fixed and most consistent with soft tissue attenuation. Small, mild intensity, mid lateral defect is partially reversible and suggestive of a mild region of ischemia. This is a low risk study. Nuclear stress EF: 69%.  Laboratory Data:  High Sensitivity Troponin:   Recent Labs  Lab 08/11/22 1441 08/11/22 1613  TROPONINIHS 5 5     Chemistry Recent Labs  Lab 08/11/22 1441 08/12/22 0457  NA 139 139  K 4.0 3.7  CL 106 106  CO2 26 26  GLUCOSE 153* 138*  BUN 29* 25*  CREATININE 1.22* 1.07*  CALCIUM 9.6 9.2  GFRNONAA 42* 49*  ANIONGAP 7 7    No results for input(s): "PROT", "ALBUMIN", "AST", "ALT", "ALKPHOS", "BILITOT" in the last 168 hours. Lipids No results for input(s): "CHOL", "TRIG", "HDL", "LABVLDL", "LDLCALC", "CHOLHDL" in the last 168 hours.  Hematology Recent Labs  Lab 08/11/22 1441 08/12/22 0457  WBC 6.5 5.4  RBC 3.86* 3.44*  HGB 11.6* 10.4*  HCT 35.2* 31.6*  MCV 91.2 91.9  MCH 30.1 30.2  MCHC  33.0 32.9  RDW 13.2 13.2  PLT 222 200   Thyroid No results for input(s): "TSH", "FREET4" in the last 168 hours.  BNPNo results for input(s): "BNP", "PROBNP" in the last 168 hours.  DDimer No results for input(s): "DDIMER" in the last 168 hours.   Radiology/Studies:  DG Chest Port 1 View  Result Date: 08/11/2022 CLINICAL DATA:  Chest pain.  Chest heaviness, shortness of breath. EXAM: PORTABLE CHEST 1 VIEW COMPARISON:  Radiograph 11/10/2020 FINDINGS: Mild cardiomegaly. Stable mediastinal contours with aortic atherosclerosis. No focal airspace disease, pulmonary edema, pleural effusion or pneumothorax. Chronic left shoulder arthropathy. IMPRESSION: Unchanged mild cardiomegaly. No acute chest findings. Electronically Signed   By: Narda Rutherford M.D.   On: 08/11/2022 15:07     Assessment and Plan:   1. Chest Pain with Mixed Features/Dyspnea on Exertion - Reports episodes of chest pressure which could last throughout the day for the past few days but also reports worsening fatigue and dyspnea on exertion. Pain did improve with SL NTG x2 while in the ED.  - Initial and repeat Hs Troponin values have been negative at 5. EKG shows known LBBB. She has been bradycardiac on telemetry with pauses up to 2.5 seconds.  - Will order an echocardiogram to assess for any structural abnormalities. At this time, it is unclear if her symptoms are due to ischemia, anemia, elevated BP or possible chronotropic incompetence in the setting of bradycardia.  - Will stop Lopressor 12.5mg  BID. Will start Imdur 30mg  daily to assist with BP control and also for her pain. If symptoms improve with medication changes and echocardiogram is reassuring, would try to avoid an invasive workup given her age and having ruled-out for ACS. Not an ideal candidate for Lexiscan Myoview this AM given possible episodes of 2nd Degree Type 2 block (will review strips with Dr. ). If symptoms do not improve or echo is abnormal, would need to  address further work-up with the patient and her family.   2. CAD - She is s/p DES to proximal RCA in 2014 and NST in 2017 showed an area of mild ischemia but overall low-risk and medically managed.  - Enzymes have been negative this admission and EKG shows her known LBBB. Will plan to obtain a follow-up echocardiogram.  - Continue ASA 81mg  daily and Crestor 10mg  daily. Will start Imdur as outlined above. Stop beta-blocker due to bradycardia.   3. HTN - BP has been variable since admission, at 125/68 - 168/60. She has been continued on Avapro 300mg  daily and HCTZ was held on admission given her elevated creatinine. She was only taking Clonidine PRN at home but this was ordered as a BID medication on admission. Would try to avoid using Clonidine in a 88 yo. Will continue Avapro and add Imdur. If BP remains elevated, can add Amlodipine.   4. HLD - FLP in 12/2021 showed her LDL was at 89. Will recheck an FLP with AM labs. Continue Crestor 10mg  daily for now.   5. Anemia - Hgb is at 10.4 today. No reports of active bleeding. Further workup per the admitting team.    For questions or updates, please contact [facility] Please consult [email or web address] for contact info under    Signed, , PA-C  08/12/2022 9:24 AM  Patient seen and examined   I agree with  findings of B Strader above  Pt with hx of CAD, HTN, HL, LBBB, First degree AV block    Pt admitted yesterday from home    She says she had an episode of chest pressure that was severe   Occurred at rest   "An awful feeling"    SOme SOB    The pt says recently she has had more fatigue, dyspnea, some  chest discomfort that lasted through day  Came to ED    Given NTG x 2 with some easing   Current denies CP   Breathing is OK laying flat  Since admit HR 30s 80s     SBP 125-160/   ON exam: Neck:   JVP is normal Lungs   CTA Cardiac RRR  Gr IIVI systolic murmur LSB   Abd Supple  No hepatomegaly Ext without LE edema    Feet warm  Trop neg x 2.     EKG with LBBB (old)  SB/SR with first degree AV block   INtermitt Type I second degree AV block  Echo:  Prelim   I have reviewed initial images LVEF normal  60%  Septal motion consistent with conduction delay  Impression    1  Chest discomfort   Pt with signifcant episode yesterday  COncerning given Hx    Troponins neg x 2. SInce admit she has been hypertensive   She has also been bradycardic       REcomm    Continue tele for now   AMbulate this afternoon Would work to get BP under better control Hold on further testing for right now  2  HTN   BP is elevated   I would recomm adding  amlodipoine 2.5 mg to regimen     Note imdur added   Stop metoprolol  3  Rhythm:    Pt with bradycarida, Wenkebach   IN setting of LBBB  Later today ambulate   Follow symptoms       Dorris Carnes  MD

## 2022-08-13 DIAGNOSIS — R079 Chest pain, unspecified: Secondary | ICD-10-CM | POA: Diagnosis not present

## 2022-08-13 DIAGNOSIS — I10 Essential (primary) hypertension: Secondary | ICD-10-CM | POA: Diagnosis not present

## 2022-08-13 DIAGNOSIS — E119 Type 2 diabetes mellitus without complications: Secondary | ICD-10-CM | POA: Diagnosis not present

## 2022-08-13 LAB — LIPID PANEL
Cholesterol: 129 mg/dL (ref 0–200)
HDL: 39 mg/dL — ABNORMAL LOW (ref >40)
LDL Cholesterol: 60 mg/dL (ref 0–99)
Total CHOL/HDL Ratio: 3.3 RATIO
Triglycerides: 151 mg/dL — ABNORMAL HIGH (ref <150)
VLDL: 30 mg/dL (ref 0–40)

## 2022-08-13 LAB — COMPREHENSIVE METABOLIC PANEL
ALT: 16 U/L (ref 0–44)
AST: 18 U/L (ref 15–41)
Albumin: 3.4 g/dL — ABNORMAL LOW (ref 3.5–5.0)
Alkaline Phosphatase: 56 U/L (ref 38–126)
Anion gap: 9 (ref 5–15)
BUN: 26 mg/dL — ABNORMAL HIGH (ref 8–23)
CO2: 26 mmol/L (ref 22–32)
Calcium: 9.2 mg/dL (ref 8.9–10.3)
Chloride: 105 mmol/L (ref 98–111)
Creatinine, Ser: 1.05 mg/dL — ABNORMAL HIGH (ref 0.44–1.00)
GFR, Estimated: 50 mL/min — ABNORMAL LOW (ref >60)
Glucose, Bld: 140 mg/dL — ABNORMAL HIGH (ref 70–99)
Potassium: 3.7 mmol/L (ref 3.5–5.1)
Sodium: 140 mmol/L (ref 135–145)
Total Bilirubin: 0.7 mg/dL (ref 0.3–1.2)
Total Protein: 5.9 g/dL — ABNORMAL LOW (ref 6.5–8.1)

## 2022-08-13 LAB — CBC WITH DIFFERENTIAL/PLATELET
Abs Immature Granulocytes: 0.01 10*3/uL (ref 0.00–0.07)
Basophils Absolute: 0 10*3/uL (ref 0.0–0.1)
Basophils Relative: 0 %
Eosinophils Absolute: 0 10*3/uL (ref 0.0–0.5)
Eosinophils Relative: 1 %
HCT: 32.4 % — ABNORMAL LOW (ref 36.0–46.0)
Hemoglobin: 10.6 g/dL — ABNORMAL LOW (ref 12.0–15.0)
Immature Granulocytes: 0 %
Lymphocytes Relative: 23 %
Lymphs Abs: 1.5 10*3/uL (ref 0.7–4.0)
MCH: 29.8 pg (ref 26.0–34.0)
MCHC: 32.7 g/dL (ref 30.0–36.0)
MCV: 91 fL (ref 80.0–100.0)
Monocytes Absolute: 0.5 10*3/uL (ref 0.1–1.0)
Monocytes Relative: 7 %
Neutro Abs: 4.5 10*3/uL (ref 1.7–7.7)
Neutrophils Relative %: 69 %
Platelets: 187 10*3/uL (ref 150–400)
RBC: 3.56 MIL/uL — ABNORMAL LOW (ref 3.87–5.11)
RDW: 13.2 % (ref 11.5–15.5)
WBC: 6.5 10*3/uL (ref 4.0–10.5)
nRBC: 0 % (ref 0.0–0.2)

## 2022-08-13 LAB — TSH: TSH: 1.683 u[IU]/mL (ref 0.350–4.500)

## 2022-08-13 LAB — PHOSPHORUS: Phosphorus: 3.7 mg/dL (ref 2.5–4.6)

## 2022-08-13 LAB — MAGNESIUM: Magnesium: 2.2 mg/dL (ref 1.7–2.4)

## 2022-08-13 MED ORDER — AMLODIPINE BESYLATE 5 MG PO TABS
5.0000 mg | ORAL_TABLET | Freq: Every day | ORAL | Status: DC
  Administered 2022-08-13 – 2022-08-14 (×2): 5 mg via ORAL
  Filled 2022-08-13: qty 1

## 2022-08-13 MED ORDER — ORAL CARE MOUTH RINSE: 15.0000 mL | OROMUCOSAL | Status: DC | PRN

## 2022-08-13 NOTE — Progress Notes (Signed)
PROGRESS NOTE    Maria Durham  ZOX:[id number] DOB: 1929-08-11 DOA: 08/11/2022 PCP: Kirstie Peri, MD   Brief Narrative:  HPI per DR. Ejiroghene Emokpae on 08/11/22  Maria Durham is a 88 y.o. female with medical history significant for hypertension, coronary artery disease, diabetes mellitus. Patient presented to the ED with complaints of chest pain that started 3 days ago.  She describes chest pain as pressure-like, and nonradiating.  Chest pain was initially intermittent.  Denies chest pains with daily activity, no known relieving factors.  Reports chest pain was associated with difficulty catching her breath today.  Otherwise no nausea no vomiting no dizziness.  No leg swelling.  Today chest pain woke up in the morning, and has been present for most of the day and improved after 2 nitro was given in ED She has had a stents placed in her heart in 2014 at  [facility], she is unable to tell if this pain is similar to that.   ED Course: Blood pressure 120s to 160s.  Heart rate 60s to 70s. Troponin 5 > 5.  Chest x-ray clear.  EKG without use, shows old left bundle branch block. Hospitalist to admit for chest pain.   **Interim History  Cardiology was consulted for the patient's chest pain and recommended obtaining echocardiogram.  Her beta-blocker was held due to her Josue Hector and sinus bradycardia.  Patient is ruled out for MI and echo shows normal left ventricular function however heart rates are still trending low beta-blocker has been discontinued and stress test has not been ordered.  Her blood pressure is not controlled though and so cardiology has added amlodipine and will increase today.  They have not ordered a stress test currently and we will see how she is doing in the morning.  They are recommending to have the patient ambulate and see how she does today with possible discharge tomorrow.   Assessment and Plan:  * Chest pain rule out ACS, improving  -Chest pain improved with  nitro.   -High risk for ACS with prior PCI with stents in 2014 at [facility].   -Troponin 5x2.   -EKG with old LBBB and first-degree heart block. -Per care everywhere, 03/2013- diagnostic cardiac catheterization and was found to have a high-grade 95% calcified eccentric stenosis of the proximal RCA, with calcified shelf of ostium. The patient was treated with a drug-eluting stent. -Was initially NPO now on a Diet  -Cardiology has repeated an echocardiogram which showed indeterminate  LVDP and LVEF of 555-60% and has moderate Concentric LV Hypertrophy -Received Aspirin 324 mg once, continue home 81 mg daily -Takes Metoprolol Tartrate 12.5 mg po BID and was ordered last night but held and discontinued by cardio allergy given her bradycardia -C/w Rosuvastatin 10 mg po Daily -C/w NTG SL 0.4 mg q55min x3 PRN -Further care per cardiology and she has been started on Imdur; cardiology recommending also ambulating for now and working on getting blood pressure under better control -Cardiology feels that she is not an ideal candidate for Lexiscan Myoview this morning given her possible episodes of second-degree type II block -Cardiology feels that she is ruled out for an MI but recommending continuing observation given that she needs to ambulate to see if she has any dyspnea or symptoms   Sinus bradycardia with first-degree AV block with intermittent type I second-degree AV block -Her beta-blocker has been discontinued and cardiology allowing for washout -Echocardiogram being repeated as below   Diabetes [facility]) -Not  on medication. -HgbA1c still pending and will reorder for the morning -CBGs fasting daily ranging from 138-153   Essential hypertension, benign -Blood Pressure 120s to 160s. -See above about Metoprolol being now discontinued given her bradycardia -Valsartan 300 mg po Daily being substituted by hospital Irbesartan 300 mg po Daily -C/w Clonidine 0.1 mg po BID  -Initiated on Imdur 30  mg p.o. daily -Will D/C HCTZ for now in case she goes for Cath -Cardiology added amlodipine yesterday and given that her blood pressure still remain elevated they are uptitrating it today -Blood pressure was 167/65 and earlier is 185/62 but is now improved to 137/59   CKD Stage 3a -Patient's BUN/Cr improved from admission and is now 25/1.07 -Avoid further Nephrotoxic Medications, Contrast Dyes, Hypotension to ensure adequate Renal perfusion -Will need to Renally adjust medications -Repeat CMP in the AM    HLD -C/w Rosuvastatin 10 mg po Daily  -Cardiology is checking a fasting lipid panel and it showed a total cholesterol/HDL ratio of 3.3, cholesterol of 129, HDL 39, LDL 60, triglycerides 151, VLDL 30  Hypoalbuminemia -The patient's albumin level is now 3.4 -Continue to monitor and trend and repeat CMP in the a.m.   Hypothyroidism -C/w Levothyroxine 75 mcg po Daily -TSH was 1.683   Normocytic Anemia -Patient's Hgb/Hct went from 11.6/35.2 -> 10.4/31.6 -Check Anemia Panel in the AM -Continue to Monitor for S/Sx of Bleeding; No overt bleeding noted -Repeat CBC in the AM    GERD/GI Prophylaxis -C/w Pantoprazole 40 mg po Daily   Obesity -Complicates overall prognosis and care -Estimated body mass index is 37.14 kg/m as calculated from the following:   Height as of this encounter: 5\' 2"  (1.575 m).   Weight as of this encounter: 92.1 kg.  -Weight Loss and Dietary Counseling given  DVT prophylaxis: enoxaparin (LOVENOX) injection 40 mg Start: 08/12/22 1000    Code Status: Full Code Family Communication: Discussed with daughter at bedside  Disposition Plan:  Level of care: Telemetry Status is: Observation The patient remains OBS appropriate and will d/c before 2 midnights.  Anticipating discharging home if she gets cardiac clearance    Consultants:  Cardiology  Procedures:  Echocardiogram IMPRESSIONS     1. Septal motion consistent with conduction delay. Left  ventricular  ejection fraction, by estimation, is 55 to 60%. The left ventricle has  normal function. There is moderate concentric left ventricular  hypertrophy. Left ventricular diastolic parameters  are indeterminate.   2. Right ventricular systolic function is normal. The right ventricular  size is normal. There is normal pulmonary artery systolic pressure.   3. Trivial mitral valve regurgitation.   4. The aortic valve is tricuspid. Aortic valve regurgitation is not  visualized. Aortic valve sclerosis/calcification is present, without any  evidence of aortic stenosis.   5. The inferior vena cava is normal in size with greater than 50%  respiratory variability, suggesting right atrial pressure of 3 mmHg.   FINDINGS   Left Ventricle: Septal motion consistent with conduction delay. Left  ventricular ejection fraction, by estimation, is 55 to 60%. The left  ventricle has normal function. Definity contrast agent was given IV to  delineate the left ventricular endocardial  borders. The left ventricular internal cavity size was normal in size.  There is moderate concentric left ventricular hypertrophy. Left  ventricular diastolic parameters are indeterminate.   Right Ventricle: The right ventricular size is normal. Right vetricular  wall thickness was not assessed. Right ventricular systolic function is  normal. There is normal  pulmonary artery systolic pressure. The tricuspid  regurgitant velocity is 2.58 m/s,  and with an assumed right atrial pressure of 3 mmHg, the estimated right  ventricular systolic pressure is 29.6 mmHg.   Left Atrium: Left atrial size was normal in size.   Right Atrium: Right atrial size was normal in size.   Pericardium: There is no evidence of pericardial effusion.   Mitral Valve: There is mild thickening of the mitral valve leaflet(s).  Mild mitral annular calcification. Trivial mitral valve regurgitation. MV  peak gradient, 3.4 mmHg. The mean mitral  valve gradient is 1.0 mmHg.   Tricuspid Valve: The tricuspid valve is normal in structure. Tricuspid  valve regurgitation is trivial.   Aortic Valve: The aortic valve is tricuspid. Aortic valve regurgitation is  not visualized. Aortic valve sclerosis/calcification is present, without  any evidence of aortic stenosis. Aortic valve mean gradient measures 9.0  mmHg. Aortic valve peak gradient  measures 14.9 mmHg. Aortic valve area, by VTI measures 2.32 cm.   Pulmonic Valve: The pulmonic valve was normal in structure. Pulmonic valve  regurgitation is mild.   Aorta: The aortic root and ascending aorta are structurally normal, with  no evidence of dilitation.   Venous: The inferior vena cava is normal in size with greater than 50%  respiratory variability, suggesting right atrial pressure of 3 mmHg.   IAS/Shunts: No atrial level shunt detected by color flow Doppler.      LEFT VENTRICLE  PLAX 2D  LVIDd:         4.40 cm     Diastology  LVIDs:         3.10 cm     LV e' medial:    4.57 cm/s  LV PW:         1.60 cm     LV E/e' medial:  12.2  LV IVS:        1.50 cm     LV e' lateral:   5.55 cm/s  LVOT diam:     2.00 cm     LV E/e' lateral: 10.0  LV SV:         114  LV SV Index:   59  LVOT Area:     3.14 cm     LV Volumes (MOD)  LV vol d, MOD A4C: 45.1 ml  LV vol s, MOD A4C: 17.9 ml  LV SV MOD A4C:     45.1 ml   RIGHT VENTRICLE  RV Basal diam:  3.60 cm  RV Mid diam:    2.90 cm  RV S prime:     9.14 cm/s  TAPSE (M-mode): 2.2 cm   LEFT ATRIUM             Index        RIGHT ATRIUM           Index  LA diam:        4.40 cm 2.29 cm/m   RA Area:     19.40 cm  LA Vol (A2C):   58.1 ml 30.20 ml/m  RA Volume:   55.70 ml  28.95 ml/m  LA Vol (A4C):   56.4 ml 29.31 ml/m  LA Biplane Vol: 59.8 ml 31.08 ml/m   AORTIC VALVE                     PULMONIC VALVE  AV Area (Vmax):    2.46 cm      PV Vmax:  1.30 m/s  AV Area (Vmean):   2.33 cm      PV Peak grad:  6.8 mmHg  AV Area  (VTI):     2.32 cm  AV Vmax:           193.00 cm/s  AV Vmean:          140.000 cm/s  AV VTI:            0.491 m  AV Peak Grad:      14.9 mmHg  AV Mean Grad:      9.0 mmHg  LVOT Vmax:         151.00 cm/s  LVOT Vmean:        104.000 cm/s  LVOT VTI:          0.362 m  LVOT/AV VTI ratio: 0.74     AORTA  Ao Root diam: 3.40 cm  Ao Asc diam:  3.30 cm   MITRAL VALVE                TRICUSPID VALVE  MV Area (PHT): 3.40 cm     TR Peak grad:   26.6 mmHg  MV Area VTI:   4.39 cm     TR Vmax:        258.00 cm/s  MV Peak grad:  3.4 mmHg  MV Mean grad:  1.0 mmHg     SHUNTS  MV Vmax:       0.92 m/s     Systemic VTI:  0.36 m  MV Vmean:      54.3 cm/s    Systemic Diam: 2.00 cm  MV Decel Time: 223 msec  MV E velocity: 55.70 cm/s  MV A velocity: 104.00 cm/s  MV E/A ratio:  0.54   Antimicrobials:  Anti-infectives (From admission, onward)    None       Subjective: Seen and examined at bedside and the patient was resting in bed without issues.  Blood pressure still remained elevated but states that her chest discomfort and pressure is improved.  Denies any lightheadedness or dizziness.  No other concerns or complaints at this time.  Objective: Vitals:   08/12/22 2130 08/13/22 0447 08/13/22 0950 08/13/22 1349  BP: (!) 156/59 (!) 185/62 (!) 167/65 (!) 137/59  Pulse: 71 71  82  Resp: Temp: 98 F (36.7 C) 98 F (36.7 C)  (!) 97.5 F (36.4 C)  TempSrc: Oral Oral  Oral  SpO2: 97% 97% 98% 97%  Weight:      Height:        Intake/Output Summary (Last 24 hours) at 08/13/2022 1704 Last data filed at 08/13/2022 1610 Gross per 24 hour  Intake 840 ml  Output --  Net 840 ml   Filed Weights   08/11/22 2008 08/11/22 2239  Weight: 92.1 kg 92.1 kg   Examination: Physical Exam:  Constitutional: WN/WD obese elderly Caucasian female currently no acute distress appears calm and comfortable in the bed Respiratory: Diminished to auscultation bilaterally, no wheezing, rales, rhonchi or  crackles. Normal respiratory effort and patient is not tachypenic. No accessory muscle use.  Unlabored breathing Cardiovascular: RRR, no murmurs / rubs / gallops. S1 and S2 auscultated.  Mild extremity edema Abdomen: Soft, non-tender, distended second body habitus. Bowel sounds positive.  GU: Deferred. Musculoskeletal: No clubbing / cyanosis of digits/nails. No joint deformity upper and lower extremities. Skin: No rashes, lesions, ulcers on a limited skin evaluation. No induration; Warm and dry.  Neurologic: CN  2-12 grossly intact with no focal deficits.  Romberg sign and cerebellar reflexes not assessed.  Psychiatric: Normal judgment and insight. Alert and oriented x 3. Normal mood and appropriate affect.   Data Reviewed: I have personally reviewed following labs and imaging studies  CBC: Recent Labs  Lab 08/11/22 1441 08/12/22 0457 08/13/22 0248  WBC 6.5 5.4 6.5  NEUTROABS  --   --  4.5  HGB 11.6* 10.4* 10.6*  HCT 35.2* 31.6* 32.4*  MCV 91.2 91.9 91.0  PLT 222 200 187   Basic Metabolic Panel: Recent Labs  Lab 08/11/22 1441 08/12/22 0457 08/13/22 0248  NA 139 139 140  K 4.0 3.7 3.7  CL 106 106 105  CO2 26 26 26   GLUCOSE 153* 138* 140*  BUN 29* 25* 26*  CREATININE 1.22* 1.07* 1.05*  CALCIUM 9.6 9.2 9.2  MG  --   --  2.2  PHOS  --   --  3.7   GFR: Estimated Creatinine Clearance: 36.9 mL/min (A) (by C-G formula based on SCr of 1.05 mg/dL (H)). Liver Function Tests: Recent Labs  Lab 08/13/22 0248  AST 18  ALT 16  ALKPHOS 56  BILITOT 0.7  PROT 5.9*  ALBUMIN 3.4*   No results for input(s): "LIPASE", "AMYLASE" in the last 168 hours. No results for input(s): "AMMONIA" in the last 168 hours. Coagulation Profile: Recent Labs  Lab 08/11/22 1441  INR 0.9   Cardiac Enzymes: No results for input(s): "CKTOTAL", "CKMB", "CKMBINDEX", "TROPONINI" in the last 168 hours. BNP (last 3 results) No results for input(s): "PROBNP" in the last 8760 hours. HbA1C: No results  for input(s): "HGBA1C" in the last 72 hours. CBG: No results for input(s): "GLUCAP" in the last 168 hours. Lipid Profile: Recent Labs    08/13/22 0248  CHOL 129  HDL 39*  LDLCALC 60  TRIG 191151*  CHOLHDL 3.3   Thyroid Function Tests: Recent Labs    08/13/22 0248  TSH 1.683   Anemia Panel: No results for input(s): "VITAMINB12", "FOLATE", "FERRITIN", "TIBC", "IRON", "RETICCTPCT" in the last 72 hours. Sepsis Labs: No results for input(s): "PROCALCITON", "LATICACIDVEN" in the last 168 hours.  No results found for this or any previous visit (from the past 240 hour(s)).   Radiology Studies: ECHOCARDIOGRAM COMPLETE  Result Date: 08/12/2022    ECHOCARDIOGRAM REPORT   Patient Name:   Ellin MayhewRENE S Iwanicki Date of Exam: 08/12/2022 Medical Rec #:  [id number]     Height:       62.0 in Accession #:    [id number]    Weight:       203.0 lb Date of Birth:  13-May-1929     BSA:          1.924 m Patient Age:    89 years      BP:           147/43 mmHg Patient Gender: F             HR:           66 bpm. [facility] Procedure: 2D Echo, Cardiac Doppler, Color Doppler and Intracardiac            Opacification Agent Indications:    Chest pain  History:        Patient has prior history of Echocardiogram examinations, most                 recent 12/24/2009. Signs/Symptoms:Chest Pain; Risk  Factors:Hypertension and Diabetes.  Sonographer:    Mikki Harbor Referring Phys: [id number] Ellsworth Lennox IMPRESSIONS  1. Septal motion consistent with conduction delay. Left ventricular ejection fraction, by estimation, is 55 to 60%. The left ventricle has normal function. There is moderate concentric left ventricular hypertrophy. Left ventricular diastolic parameters are indeterminate.  2. Right ventricular systolic function is normal. The right ventricular size is normal. There is normal pulmonary artery systolic pressure.  3. Trivial mitral valve regurgitation.  4. The aortic valve is tricuspid. Aortic  valve regurgitation is not visualized. Aortic valve sclerosis/calcification is present, without any evidence of aortic stenosis.  5. The inferior vena cava is normal in size with greater than 50% respiratory variability, suggesting right atrial pressure of 3 mmHg. FINDINGS  Left Ventricle: Septal motion consistent with conduction delay. Left ventricular ejection fraction, by estimation, is 55 to 60%. The left ventricle has normal function. Definity contrast agent was given IV to delineate the left ventricular endocardial borders. The left ventricular internal cavity size was normal in size. There is moderate concentric left ventricular hypertrophy. Left ventricular diastolic parameters are indeterminate. Right Ventricle: The right ventricular size is normal. Right vetricular wall thickness was not assessed. Right ventricular systolic function is normal. There is normal pulmonary artery systolic pressure. The tricuspid regurgitant velocity is 2.58 m/s, and with an assumed right atrial pressure of 3 mmHg, the estimated right ventricular systolic pressure is 29.6 mmHg. Left Atrium: Left atrial size was normal in size. Right Atrium: Right atrial size was normal in size. Pericardium: There is no evidence of pericardial effusion. Mitral Valve: There is mild thickening of the mitral valve leaflet(s). Mild mitral annular calcification. Trivial mitral valve regurgitation. MV peak gradient, 3.4 mmHg. The mean mitral valve gradient is 1.0 mmHg. Tricuspid Valve: The tricuspid valve is normal in structure. Tricuspid valve regurgitation is trivial. Aortic Valve: The aortic valve is tricuspid. Aortic valve regurgitation is not visualized. Aortic valve sclerosis/calcification is present, without any evidence of aortic stenosis. Aortic valve mean gradient measures 9.0 mmHg. Aortic valve peak gradient measures 14.9 mmHg. Aortic valve area, by VTI measures 2.32 cm. Pulmonic Valve: The pulmonic valve was normal in structure. Pulmonic  valve regurgitation is mild. Aorta: The aortic root and ascending aorta are structurally normal, with no evidence of dilitation. Venous: The inferior vena cava is normal in size with greater than 50% respiratory variability, suggesting right atrial pressure of 3 mmHg. IAS/Shunts: No atrial level shunt detected by color flow Doppler.  LEFT VENTRICLE PLAX 2D LVIDd:         4.40 cm     Diastology LVIDs:         3.10 cm     LV e' medial:    4.57 cm/s LV PW:         1.60 cm     LV E/e' medial:  12.2 LV IVS:        1.50 cm     LV e' lateral:   5.55 cm/s LVOT diam:     2.00 cm     LV E/e' lateral: 10.0 LV SV:         114 LV SV Index:   59 LVOT Area:     3.14 cm  LV Volumes (MOD) LV vol d, MOD A4C: 45.1 ml LV vol s, MOD A4C: 17.9 ml LV SV MOD A4C:     45.1 ml RIGHT VENTRICLE RV Basal diam:  3.60 cm RV Mid diam:    2.90 cm RV S prime:  9.14 cm/s TAPSE (M-mode): 2.2 cm LEFT ATRIUM             Index        RIGHT ATRIUM           Index LA diam:        4.40 cm 2.29 cm/m   RA Area:     19.40 cm LA Vol (A2C):   58.1 ml 30.20 ml/m  RA Volume:   55.70 ml  28.95 ml/m LA Vol (A4C):   56.4 ml 29.31 ml/m LA Biplane Vol: 59.8 ml 31.08 ml/m  AORTIC VALVE                     PULMONIC VALVE AV Area (Vmax):    2.46 cm      PV Vmax:       1.30 m/s AV Area (Vmean):   2.33 cm      PV Peak grad:  6.8 mmHg AV Area (VTI):     2.32 cm AV Vmax:           193.00 cm/s AV Vmean:          140.000 cm/s AV VTI:            0.491 m AV Peak Grad:      14.9 mmHg AV Mean Grad:      9.0 mmHg LVOT Vmax:         151.00 cm/s LVOT Vmean:        104.000 cm/s LVOT VTI:          0.362 m LVOT/AV VTI ratio: 0.74  AORTA Ao Root diam: 3.40 cm Ao Asc diam:  3.30 cm MITRAL VALVE                TRICUSPID VALVE MV Area (PHT): 3.40 cm     TR Peak grad:   26.6 mmHg MV Area VTI:   4.39 cm     TR Vmax:        258.00 cm/s MV Peak grad:  3.4 mmHg MV Mean grad:  1.0 mmHg     SHUNTS MV Vmax:       0.92 m/s     Systemic VTI:  0.36 m MV Vmean:      54.3 cm/s    Systemic  Diam: 2.00 cm MV Decel Time: 223 msec MV E velocity: 55.70 cm/s MV A velocity: 104.00 cm/s MV E/A ratio:  0.54 Dorris Carnes MD Electronically signed by Dorris Carnes MD Signature Date/Time: 08/12/2022/4:01:09 PM    Final     Scheduled Meds:  amLODipine  5 mg Oral Daily   aspirin  81 mg Oral Daily   enoxaparin (LOVENOX) injection  40 mg Subcutaneous Q24H   irbesartan  300 mg Oral Daily   isosorbide mononitrate  30 mg Oral Daily   levothyroxine  75 mcg Oral Q0600   pantoprazole  40 mg Oral Daily   rosuvastatin  10 mg Oral Daily   Continuous Infusions:   LOS: 0 days   Raiford Noble, DO [facility] Available via Epic secure chat 7am-7pm After these hours, please refer to coverage provider listed on [email or web address] 08/13/2022, 5:04 PM

## 2022-08-13 NOTE — Progress Notes (Signed)
Rounding Note    Patient Name: Maria Durham Date of Encounter: 08/13/2022  [facility] Cardiologist: Nona DellSamuel McDowell, MD   Subjective   Denies CP   Breathing is OK at rest    Inpatient Medications    Scheduled Meds:  amLODipine  5 mg Oral Daily   aspirin  81 mg Oral Daily   enoxaparin (LOVENOX) injection  40 mg Subcutaneous Q24H   irbesartan  300 mg Oral Daily   isosorbide mononitrate  30 mg Oral Daily   levothyroxine  75 mcg Oral Q0600   pantoprazole  40 mg Oral Daily   rosuvastatin  10 mg Oral Daily   Continuous Infusions:  PRN Meds: acetaminophen **OR** acetaminophen, nitroGLYCERIN, ondansetron **OR** ondansetron (ZOFRAN) IV, mouth rinse, polyethylene glycol   Vital Signs    Vitals:   08/12/22 1210 08/12/22 2130 08/13/22 0447 08/13/22 0950  BP: (!) 143/60 (!) 156/59 (!) 185/62 (!) 167/65  Pulse: 66 71 71   Resp:  20 20   Temp:  98 F (36.7 C) 98 F (36.7 C)   TempSrc:  Oral Oral   SpO2: 97% 97% 97% 98%  Weight:      Height:        Intake/Output Summary (Last 24 hours) at 08/13/2022 0956 Last data filed at 08/12/2022 1808 Gross per 24 hour  Intake 840 ml  Output --  Net 840 ml      08/11/2022   10:39 PM 08/11/2022    8:08 PM 11/11/2021   12:48 PM  Last 3 Weights  Weight (lbs) 203 lb 0.7 oz 203 lb 0.7 oz 203 lb  Weight (kg) 92.1 kg 92.1 kg 92.08 kg      Telemetry    SB, SR   First degree AV Block, BBB.  Type I second degree AV block   HR 50s to 60s  - Personally Reviewed  ECG    No new  - Personally Reviewed  Physical Exam   GEN: No acute distress.   Neck: No JVD Cardiac: RRR, no murmurs, Respiratory: Clear to auscultation bilaterally. GI: Soft, nontender, non-distended  MS: No edema; No deformity. Neuro:  Nonfocal  Psych: Normal affect   Labs    High Sensitivity Troponin:   Recent Labs  Lab 08/11/22 1441 08/11/22 1613  TROPONINIHS 5 5     Chemistry Recent Labs  Lab 08/11/22 1441 08/12/22 0457 08/13/22 0248   NA 139 139 140  K 4.0 3.7 3.7  CL 106 106 105  CO2 26 26 26   GLUCOSE 153* 138* 140*  BUN 29* 25* 26*  CREATININE 1.22* 1.07* 1.05*  CALCIUM 9.6 9.2 9.2  MG  --   --  2.2  PROT  --   --  5.9*  ALBUMIN  --   --  3.4*  AST  --   --  18  ALT  --   --  16  ALKPHOS  --   --  56  BILITOT  --   --  0.7  GFRNONAA 42* 49* 50*  ANIONGAP 7 7 9     Lipids  Recent Labs  Lab 08/13/22 0248  CHOL 129  TRIG 151*  HDL 39*  LDLCALC 60  CHOLHDL 3.3    Hematology Recent Labs  Lab 08/11/22 1441 08/12/22 0457 08/13/22 0248  WBC 6.5 5.4 6.5  RBC 3.86* 3.44* 3.56*  HGB 11.6* 10.4* 10.6*  HCT 35.2* 31.6* 32.4*  MCV 91.2 91.9 91.0  MCH 30.1 30.2 29.8  MCHC 33.0 32.9  32.7  RDW 13.2 13.2 13.2  PLT 222 200 187   Thyroid  Recent Labs  Lab 08/13/22 0248  TSH 1.683    BNPNo results for input(s): "BNP", "PROBNP" in the last 168 hours.  DDimer No results for input(s): "DDIMER" in the last 168 hours.   Radiology    ECHOCARDIOGRAM COMPLETE  Result Date: 08/12/2022    ECHOCARDIOGRAM REPORT   Patient Name:   Maria Durham Date of Exam: 08/12/2022 Medical Rec #:  [id number]     Height:       62.0 in Accession #:    [id number]    Weight:       203.0 lb Date of Birth:  [birth year]     BSA:          1.924 m Patient Age:    88 years      BP:           147/43 mmHg Patient Gender: F             HR:           66 bpm. [facility] Procedure: 2D Echo, Cardiac Doppler, Color Doppler and Intracardiac            Opacification Agent Indications:    Chest pain  History:        Patient has prior history of Echocardiogram examinations, most                 recent 12/24/2009. Signs/Symptoms:Chest Pain; Risk                 Factors:Hypertension and Diabetes.  Sonographer:    Mikki Harbor Referring Phys: [id number] Ellsworth Lennox IMPRESSIONS  1. Septal motion consistent with conduction delay. Left ventricular ejection fraction, by estimation, is 55 to 60%. The left ventricle has normal function. There is  moderate concentric left ventricular hypertrophy. Left ventricular diastolic parameters are indeterminate.  2. Right ventricular systolic function is normal. The right ventricular size is normal. There is normal pulmonary artery systolic pressure.  3. Trivial mitral valve regurgitation.  4. The aortic valve is tricuspid. Aortic valve regurgitation is not visualized. Aortic valve sclerosis/calcification is present, without any evidence of aortic stenosis.  5. The inferior vena cava is normal in size with greater than 50% respiratory variability, suggesting right atrial pressure of 3 mmHg. FINDINGS  Left Ventricle: Septal motion consistent with conduction delay. Left ventricular ejection fraction, by estimation, is 55 to 60%. The left ventricle has normal function. Definity contrast agent was given IV to delineate the left ventricular endocardial borders. The left ventricular internal cavity size was normal in size. There is moderate concentric left ventricular hypertrophy. Left ventricular diastolic parameters are indeterminate. Right Ventricle: The right ventricular size is normal. Right vetricular wall thickness was not assessed. Right ventricular systolic function is normal. There is normal pulmonary artery systolic pressure. The tricuspid regurgitant velocity is 2.58 m/s, and with an assumed right atrial pressure of 3 mmHg, the estimated right ventricular systolic pressure is 29.6 mmHg. Left Atrium: Left atrial size was normal in size. Right Atrium: Right atrial size was normal in size. Pericardium: There is no evidence of pericardial effusion. Mitral Valve: There is mild thickening of the mitral valve leaflet(s). Mild mitral annular calcification. Trivial mitral valve regurgitation. MV peak gradient, 3.4 mmHg. The mean mitral valve gradient is 1.0 mmHg. Tricuspid Valve: The tricuspid valve is normal in structure. Tricuspid valve regurgitation is trivial. Aortic Valve: The aortic valve is  tricuspid. Aortic valve  regurgitation is not visualized. Aortic valve sclerosis/calcification is present, without any evidence of aortic stenosis. Aortic valve mean gradient measures 9.0 mmHg. Aortic valve peak gradient measures 14.9 mmHg. Aortic valve area, by VTI measures 2.32 cm. Pulmonic Valve: The pulmonic valve was normal in structure. Pulmonic valve regurgitation is mild. Aorta: The aortic root and ascending aorta are structurally normal, with no evidence of dilitation. Venous: The inferior vena cava is normal in size with greater than 50% respiratory variability, suggesting right atrial pressure of 3 mmHg. IAS/Shunts: No atrial level shunt detected by color flow Doppler.  LEFT VENTRICLE PLAX 2D LVIDd:         4.40 cm     Diastology LVIDs:         3.10 cm     LV e' medial:    4.57 cm/s LV PW:         1.60 cm     LV E/e' medial:  12.2 LV IVS:        1.50 cm     LV e' lateral:   5.55 cm/s LVOT diam:     2.00 cm     LV E/e' lateral: 10.0 LV SV:         114 LV SV Index:   59 LVOT Area:     3.14 cm  LV Volumes (MOD) LV vol d, MOD A4C: 45.1 ml LV vol s, MOD A4C: 17.9 ml LV SV MOD A4C:     45.1 ml RIGHT VENTRICLE RV Basal diam:  3.60 cm RV Mid diam:    2.90 cm RV S prime:     9.14 cm/s TAPSE (M-mode): 2.2 cm LEFT ATRIUM             Index        RIGHT ATRIUM           Index LA diam:        4.40 cm 2.29 cm/m   RA Area:     19.40 cm LA Vol (A2C):   58.1 ml 30.20 ml/m  RA Volume:   55.70 ml  28.95 ml/m LA Vol (A4C):   56.4 ml 29.31 ml/m LA Biplane Vol: 59.8 ml 31.08 ml/m  AORTIC VALVE                     PULMONIC VALVE AV Area (Vmax):    2.46 cm      PV Vmax:       1.30 m/s AV Area (Vmean):   2.33 cm      PV Peak grad:  6.8 mmHg AV Area (VTI):     2.32 cm AV Vmax:           193.00 cm/s AV Vmean:          140.000 cm/s AV VTI:            0.491 m AV Peak Grad:      14.9 mmHg AV Mean Grad:      9.0 mmHg LVOT Vmax:         151.00 cm/s LVOT Vmean:        104.000 cm/s LVOT VTI:          0.362 m LVOT/AV VTI ratio: 0.74  AORTA Ao Root diam:  3.40 cm Ao Asc diam:  3.30 cm MITRAL VALVE                TRICUSPID VALVE MV Area (PHT): 3.40 cm     TR  Peak grad:   26.6 mmHg MV Area VTI:   4.39 cm     TR Vmax:        258.00 cm/s MV Peak grad:  3.4 mmHg MV Mean grad:  1.0 mmHg     SHUNTS MV Vmax:       0.92 m/s     Systemic VTI:  0.36 m MV Vmean:      54.3 cm/s    Systemic Diam: 2.00 cm MV Decel Time: 223 msec MV E velocity: 55.70 cm/s MV A velocity: 104.00 cm/s MV E/A ratio:  0.54 Dietrich Pates MD Electronically signed by Dietrich Pates MD Signature Date/Time: 08/12/2022/4:01:09 PM    Final    DG Chest Port 1 View  Result Date: 08/11/2022 CLINICAL DATA:  Chest pain.  Chest heaviness, shortness of breath. EXAM: PORTABLE CHEST 1 VIEW COMPARISON:  Radiograph 11/10/2020 FINDINGS: Mild cardiomegaly. Stable mediastinal contours with aortic atherosclerosis. No focal airspace disease, pulmonary edema, pleural effusion or pneumothorax. Chronic left shoulder arthropathy. IMPRESSION: Unchanged mild cardiomegaly. No acute chest findings. Electronically Signed   By: Narda Rutherford M.D.   On: 08/11/2022 15:07    Cardiac Studies   Echo 08/13/22 Septal motion consistent with conduction delay. Left ventricular ejection fraction, by estimation, is 55 to 60%. The left ventricle has normal function. There is moderate concentric left ventricular hypertrophy. Left ventricular diastolic parameters are indeterminate. 1. Right ventricular systolic function is normal. The right ventricular size is normal. There is normal pulmonary artery systolic pressure. 2. 3. Trivial mitral valve regurgitation. The aortic valve is tricuspid. Aortic valve regurgitation is not visualized. Aortic valve sclerosis/calcification is present, without any evidence of aortic stenosis. 4. The inferior vena cava is normal in size with greater than 50% respiratory variability, suggesting right atrial pressure of 3 mmHg.  Patient Profile       Maria Durham is a 88 y.o. female with a hx of CAD  (s/p DES to proximal RCA in 2014, NST in 2017 showing an area of mild ischemia but overall low-risk and medically managed), HTN, HLD and Hypothyroidism who is being seen 08/12/2022 for the evaluation of chest pain at the request of Dr. Mariea Clonts.  Assessment & Plan    1  Chest pain/pressure   Pt with CAD (DES to RCA in 2014)  PResented with CP "Awful feeling" Ruled out for MI Echo shows normal LV function    Yesterday heart rates trending quite low   BP normal to high    B Blocker was d/c'd     Stress test not ordered  I would recomm following clinically for now    Ambulate and see how patient feels  (dypsnea, tightness)  2   HTN  BP is not controlled   May be contrib to problem 1   Amlodipine added yesterday  Will increase today    3  Rhythm   Pt with significant conduction dz   Avoid meds that slow  Right now no indication for pacer         For questions or updates, please contact [facility] Please consult [email or web address] for contact info under        Signed, Dietrich Pates, MD  08/13/2022, 9:56 AM

## 2022-08-14 DIAGNOSIS — R079 Chest pain, unspecified: Secondary | ICD-10-CM | POA: Diagnosis not present

## 2022-08-14 DIAGNOSIS — N1831 Chronic kidney disease, stage 3a: Secondary | ICD-10-CM

## 2022-08-14 DIAGNOSIS — K219 Gastro-esophageal reflux disease without esophagitis: Secondary | ICD-10-CM

## 2022-08-14 DIAGNOSIS — E669 Obesity, unspecified: Secondary | ICD-10-CM

## 2022-08-14 DIAGNOSIS — I440 Atrioventricular block, first degree: Secondary | ICD-10-CM | POA: Diagnosis not present

## 2022-08-14 DIAGNOSIS — E039 Hypothyroidism, unspecified: Secondary | ICD-10-CM

## 2022-08-14 DIAGNOSIS — D649 Anemia, unspecified: Secondary | ICD-10-CM

## 2022-08-14 DIAGNOSIS — E785 Hyperlipidemia, unspecified: Secondary | ICD-10-CM

## 2022-08-14 DIAGNOSIS — E119 Type 2 diabetes mellitus without complications: Secondary | ICD-10-CM | POA: Diagnosis not present

## 2022-08-14 DIAGNOSIS — R001 Bradycardia, unspecified: Secondary | ICD-10-CM

## 2022-08-14 DIAGNOSIS — I10 Essential (primary) hypertension: Secondary | ICD-10-CM | POA: Diagnosis not present

## 2022-08-14 DIAGNOSIS — E8809 Other disorders of plasma-protein metabolism, not elsewhere classified: Secondary | ICD-10-CM

## 2022-08-14 LAB — COMPREHENSIVE METABOLIC PANEL
ALT: 14 U/L (ref 0–44)
AST: 17 U/L (ref 15–41)
Albumin: 3.6 g/dL (ref 3.5–5.0)
Alkaline Phosphatase: 57 U/L (ref 38–126)
Anion gap: 9 (ref 5–15)
BUN: 24 mg/dL — ABNORMAL HIGH (ref 8–23)
CO2: 26 mmol/L (ref 22–32)
Calcium: 9.3 mg/dL (ref 8.9–10.3)
Chloride: 106 mmol/L (ref 98–111)
Creatinine, Ser: 1.04 mg/dL — ABNORMAL HIGH (ref 0.44–1.00)
GFR, Estimated: 51 mL/min — ABNORMAL LOW (ref >60)
Glucose, Bld: 128 mg/dL — ABNORMAL HIGH (ref 70–99)
Potassium: 3.8 mmol/L (ref 3.5–5.1)
Sodium: 141 mmol/L (ref 135–145)
Total Bilirubin: 0.7 mg/dL (ref 0.3–1.2)
Total Protein: 6.3 g/dL — ABNORMAL LOW (ref 6.5–8.1)

## 2022-08-14 LAB — PHOSPHORUS: Phosphorus: 3.4 mg/dL (ref 2.5–4.6)

## 2022-08-14 LAB — CBC WITH DIFFERENTIAL/PLATELET
Abs Immature Granulocytes: 0.02 10*3/uL (ref 0.00–0.07)
Abs Immature Granulocytes: 0.03 10*3/uL (ref 0.00–0.07)
Basophils Absolute: 0 10*3/uL (ref 0.0–0.1)
Basophils Absolute: 0 10*3/uL (ref 0.0–0.1)
Basophils Relative: 1 %
Basophils Relative: 0 %
Eosinophils Absolute: 0.1 10*3/uL (ref 0.0–0.5)
Eosinophils Absolute: 0 10*3/uL (ref 0.0–0.5)
Eosinophils Relative: 1 %
Eosinophils Relative: 1 %
HCT: 33.7 % — ABNORMAL LOW (ref 36.0–46.0)
HCT: 34.4 % — ABNORMAL LOW (ref 36.0–46.0)
Hemoglobin: 11 g/dL — ABNORMAL LOW (ref 12.0–15.0)
Hemoglobin: 11.3 g/dL — ABNORMAL LOW (ref 12.0–15.0)
Immature Granulocytes: 0 %
Immature Granulocytes: 1 %
Lymphocytes Relative: 25 %
Lymphocytes Relative: 23 %
Lymphs Abs: 1.6 10*3/uL (ref 0.7–4.0)
Lymphs Abs: 1.3 10*3/uL (ref 0.7–4.0)
MCH: 30 pg (ref 26.0–34.0)
MCH: 30 pg (ref 26.0–34.0)
MCHC: 32.6 g/dL (ref 30.0–36.0)
MCHC: 32.8 g/dL (ref 30.0–36.0)
MCV: 91.8 fL (ref 80.0–100.0)
MCV: 91.2 fL (ref 80.0–100.0)
Monocytes Absolute: 0.4 10*3/uL (ref 0.1–1.0)
Monocytes Absolute: 0.3 10*3/uL (ref 0.1–1.0)
Monocytes Relative: 6 %
Monocytes Relative: 6 %
Neutro Abs: 4.3 10*3/uL (ref 1.7–7.7)
Neutro Abs: 4 10*3/uL (ref 1.7–7.7)
Neutrophils Relative %: 67 %
Neutrophils Relative %: 69 %
Platelets: 207 10*3/uL (ref 150–400)
Platelets: 198 10*3/uL (ref 150–400)
RBC: 3.67 MIL/uL — ABNORMAL LOW (ref 3.87–5.11)
RBC: 3.77 MIL/uL — ABNORMAL LOW (ref 3.87–5.11)
RDW: 13.2 % (ref 11.5–15.5)
RDW: 13.2 % (ref 11.5–15.5)
WBC: 6.5 10*3/uL (ref 4.0–10.5)
WBC: 5.7 10*3/uL (ref 4.0–10.5)
nRBC: 0 % (ref 0.0–0.2)
nRBC: 0 % (ref 0.0–0.2)

## 2022-08-14 LAB — MAGNESIUM: Magnesium: 2 mg/dL (ref 1.7–2.4)

## 2022-08-14 MED ORDER — ISOSORBIDE MONONITRATE ER 30 MG PO TB24: 30.0000 mg | ORAL_TABLET | Freq: Every day | ORAL | 0 refills | Status: AC

## 2022-08-14 MED ORDER — POLYETHYLENE GLYCOL 3350 17 G PO PACK: 17.0000 g | PACK | Freq: Every day | ORAL | 0 refills | Status: AC | PRN

## 2022-08-14 MED ORDER — ACETAMINOPHEN 325 MG PO TABS: 650.0000 mg | ORAL_TABLET | Freq: Four times a day (QID) | ORAL | 0 refills | Status: AC | PRN

## 2022-08-14 MED ORDER — AMLODIPINE BESYLATE 5 MG PO TABS: 10.0000 mg | ORAL_TABLET | Freq: Every day | ORAL | Status: DC

## 2022-08-14 MED ORDER — AMLODIPINE BESYLATE 10 MG PO TABS: 10.0000 mg | ORAL_TABLET | Freq: Every day | ORAL | 0 refills | Status: AC

## 2022-08-14 MED ORDER — ONDANSETRON HCL 4 MG PO TABS: 4.0000 mg | ORAL_TABLET | Freq: Four times a day (QID) | ORAL | 0 refills | Status: DC | PRN

## 2022-08-14 NOTE — Evaluation (Signed)
Physical Therapy Evaluation Patient Details Name: Maria Durham MRN: [id number] DOB: 05-08-1929 Today's Date: 08/14/2022  History of Present Illness  Maria Durham is a 88 y.o. female with medical history significant for hypertension, coronary artery disease, diabetes mellitus.  Patient presented to the ED with complaints of chest pain that started 3 days ago.  She describes chest pain as pressure-like, and nonradiating.  Chest pain was initially intermittent.  Denies chest pains with daily activity, no known relieving factors.  Reports chest pain was associated with difficulty catching her breath today.  Otherwise no nausea no vomiting no dizziness.  No leg swelling.  Today chest pain woke up in the morning, and has been present for most of the day and improved after 2 nitro was given in ED  She has had a stents placed in her heart in 2014 at  [facility], she is unable to tell if this pain is similar to that.   Clinical Impression  Patient presents supine in bed, and is awake, alert, and cooperative. Patient likely near baseline function at this time. No reported pain. Good seated and standing balance. Mod I for bed mobility and transfers. Able to don footware with no assist. Able to ambulate short distance unsupported with no LOB, but prefers RW for longer distance. Able to walk up and down hallway with no LOB or apparent distress. Returned to room, seated upright at EOB. Patient does not appear to require any further skilled PT intervention at this time. Patient discharged to care of nursing for ambulation daily as tolerated for length of stay.        Recommendations for follow up therapy are one component of a multi-disciplinary discharge planning process, led by the attending physician.  Recommendations may be updated based on patient status, additional functional criteria and insurance authorization.  Follow Up Recommendations No PT follow up      Assistance Recommended at Discharge  PRN  Patient can return home with the following  Help with stairs or ramp for entrance;Assist for transportation    Equipment Recommendations None recommended by PT  Recommendations for Other Services       Functional Status Assessment Patient has had a recent decline in their functional status and demonstrates the ability to make significant improvements in function in a reasonable and predictable amount of time.     Precautions / Restrictions Precautions Precautions: None Restrictions Weight Bearing Restrictions: No      Mobility  Bed Mobility Overal bed mobility: Modified Independent             General bed mobility comments: Uses bed rails    Transfers Overall transfer level: Modified independent                 General transfer comment: Bed rails for supine to sit    Ambulation/Gait Ambulation/Gait assistance: Modified independent (Device/Increase time) Gait Distance (Feet): 175 Feet Assistive device: Rolling walker (2 wheels) Gait Pattern/deviations: Decreased stride length       General Gait Details: Prefers use of RW for longer distance, able to walk with no AD short bouts with good stability, no LOB. Slightly decreased gait speed, otherwise unremarkable  Stairs            Wheelchair Mobility    Modified Rankin (Stroke Patients Only)       Balance Overall balance assessment: Modified Independent  Pertinent Vitals/Pain Pain Assessment Pain Assessment: No/denies pain    Home Living Family/patient expects to be discharged to:: Private residence Living Arrangements: Children Available Help at Discharge: Family;Available 24 hours/day Type of Home: House Home Access: Stairs to enter Entrance Stairs-Rails: None Entrance Stairs-Number of Steps: 1-2   Home Layout: One level Home Equipment: Agricultural consultant (2 wheels);Cane - single point;Shower seat;Wheelchair - manual       Prior Function Prior Level of Function : Independent/Modified Independent               ADLs Comments: Her son and daughter live with her. Daughter is an NP     Hand Dominance        Extremity/Trunk Assessment   Upper Extremity Assessment Upper Extremity Assessment: Overall WFL for tasks assessed    Lower Extremity Assessment Lower Extremity Assessment: Overall WFL for tasks assessed    Cervical / Trunk Assessment Cervical / Trunk Assessment: Normal  Communication   Communication: No difficulties  Cognition Arousal/Alertness: Awake/alert Behavior During Therapy: WFL for tasks assessed/performed Overall Cognitive Status: Within Functional Limits for tasks assessed                                          General Comments General comments (skin integrity, edema, etc.): Good seated and standing balance, prefers use of RW for ambulating farther distance    Exercises     Assessment/Plan    PT Assessment Patient does not need any further PT services  PT Problem List         PT Treatment Interventions      PT Goals (Current goals can be found in the Care Plan section)  Acute Rehab PT Goals Patient Stated Goal: Return home PT Goal Formulation: With patient Time For Goal Achievement: 08/21/22 Potential to Achieve Goals: Good    Frequency       Co-evaluation               AM-PAC PT "6 Clicks" Mobility  Outcome Measure Help needed turning from your back to your side while in a flat bed without using bedrails?: A Little Help needed moving from lying on your back to sitting on the side of a flat bed without using bedrails?: A Little Help needed moving to and from a bed to a chair (including a wheelchair)?: None Help needed standing up from a chair using your arms (e.g., wheelchair or bedside chair)?: None Help needed to walk in hospital room?: None Help needed climbing 3-5 steps with a railing? : A Little 6 Click Score: 21    End  of Session Equipment Utilized During Treatment: Gait belt Activity Tolerance: Patient tolerated treatment well Patient left: in bed;with call bell/phone within reach Nurse Communication: Mobility status PT Visit Diagnosis: Other abnormalities of gait and mobility (R26.89)    Time: 3016-0109 PT Time Calculation (min) (ACUTE ONLY): 23 min   Charges:   PT Evaluation $PT Eval Low Complexity: 1 Low     2:45 PM, 08/14/22 Georges Lynch PT DPT  Physical Therapist with [facility]  [phone]

## 2022-08-14 NOTE — Progress Notes (Signed)
Patient rested through the night. No complaints at this time.

## 2022-08-14 NOTE — Progress Notes (Signed)
Nsg Discharge Note  Admit Date:  08/11/2022 Discharge date: 08/14/2022   Maria Durham to be D/C'd Home per MD order.  AVS completed.  Patient/caregiver able to verbalize understanding.  Discharge Medication: Allergies as of 08/14/2022       Reactions   Fish Oil Hives   Oxycontin [oxycodone Hcl] Other (See Comments)   "made me crazy"        Medication List     STOP taking these medications    cloNIDine 0.1 MG tablet Commonly known as: CATAPRES   metoprolol tartrate 25 MG tablet Commonly known as: LOPRESSOR       TAKE these medications    acetaminophen 325 MG tablet Commonly known as: TYLENOL Take 2 tablets (650 mg total) by mouth every 6 (six) hours as needed for mild pain (or Fever >/= 101).   amLODipine 10 MG tablet Commonly known as: NORVASC Take 1 tablet (10 mg total) by mouth daily. Start taking on: August 15, 2022   ascorbic acid 500 MG tablet Commonly known as: VITAMIN C Take 500 mg by mouth daily.   ascorbic acid 500 MG tablet Commonly known as: VITAMIN C Take 500 mg by mouth daily.   aspirin 81 MG tablet Take 81 mg by mouth daily.   isosorbide mononitrate 30 MG 24 hr tablet Commonly known as: IMDUR Take 1 tablet (30 mg total) by mouth daily. Start taking on: August 15, 2022   levothyroxine 75 MCG tablet Commonly known as: SYNTHROID Take 75 mcg by mouth daily before breakfast.   MULTIVITAMIN PO Take by mouth.   nitroGLYCERIN 0.4 MG SL tablet Commonly known as: NITROSTAT Place 1 tablet (0.4 mg total) under the tongue every 5 (five) minutes x 3 doses as needed (if no relief after 2nd dose, proceed to ED for evaluation). Reported on 01/07/2016   ondansetron 4 MG tablet Commonly known as: ZOFRAN Take 1 tablet (4 mg total) by mouth every 6 (six) hours as needed for nausea.   pantoprazole 40 MG tablet Commonly known as: PROTONIX Take 40 mg by mouth daily.   polyethylene glycol 17 g packet Commonly known as: MIRALAX / GLYCOLAX Take  17 g by mouth daily as needed for mild constipation.   PRESERVISION AREDS 2+MULTI VIT PO Take 1 tablet by mouth daily.   rosuvastatin 10 MG tablet Commonly known as: CRESTOR Take 1 tablet (10 mg total) by mouth daily.   valsartan-hydrochlorothiazide 320-25 MG tablet Commonly known as: DIOVAN-HCT TAKE 1 TABLET BY MOUTH ONCE A DAY.   vitamin B-12 100 MCG tablet Commonly known as: CYANOCOBALAMIN Take 100 mcg by mouth daily.   Vitamin D3 125 MCG (5000 UT) Caps Take by mouth.   Zinc 100 MG Tabs Take 1 tablet by mouth daily.        Discharge Assessment: Vitals:   08/14/22 0452 08/14/22 1222  BP: (!) 179/58 (!) 158/72  Pulse: 81 84  Resp: 20 18  Temp: 97.8 F (36.6 C) 98.2 F (36.8 C)  SpO2: 96% 97%   Skin clean, dry and intact without evidence of skin break down, no evidence of skin tears noted. IV catheter discontinued intact. Site without signs and symptoms of complications - no redness or edema noted at insertion site, patient denies c/o pain - only slight tenderness at site.  Dressing with slight pressure applied.  D/c Instructions-Education: Discharge instructions given to patient/family with verbalized understanding. D/c education completed with patient/family including follow up instructions, medication list, d/c activities limitations if indicated, with other d/c  instructions as indicated by MD - patient able to verbalize understanding, all questions fully answered. Patient instructed to return to ED, call 911, or call MD for any changes in condition.  Patient escorted via [place], and D/C home via private auto.  Kathie Rhodes, RN 08/14/2022 2:48 PM

## 2022-08-14 NOTE — Discharge Summary (Signed)
Physician Discharge Summary   Patient: Maria Durham MRN: [id number] DOB: [birth year]  Admit date:     08/11/2022  Discharge date: 08/14/22  Discharge Physician: Marguerita Merles, DO   PCP: Kirstie Peri, MD   Recommendations at discharge:   Follow-up with PCP within 1 to 2 weeks and repeat CBC, CMP, mag, Phos within 1 week Follow-up with cardiology within 1 to 2 weeks and discussed with them about outpatient stress test and further blood pressure management  Discharge Diagnoses: Principal Problem:   Chest pain Active Problems:   Essential hypertension, benign   Diabetes (HCC)   Obesity (BMI 30-39.9)   GERD (gastroesophageal reflux disease)   Normocytic anemia   Hypothyroidism   Hypoalbuminemia   HLD (hyperlipidemia)   Stage 3a chronic kidney disease (CKD) (HCC)   Controlled type 2 diabetes mellitus without complication, without long-term current use of insulin (HCC)   Sinus bradycardia   First degree AV block  Resolved Problems:   * No resolved hospital problems. [facility] Course: HPI per DR. Ejiroghene Emokpae on 08/11/22  Maria Durham is a 88 y.o. female with medical history significant for hypertension, coronary artery disease, diabetes mellitus. Patient presented to the ED with complaints of chest pain that started 3 days ago.  She describes chest pain as pressure-like, and nonradiating.  Chest pain was initially intermittent.  Denies chest pains with daily activity, no known relieving factors.  Reports chest pain was associated with difficulty catching her breath today.  Otherwise no nausea no vomiting no dizziness.  No leg swelling.  Today chest pain woke up in the morning, and has been present for most of the day and improved after 2 nitro was given in ED She has had a stents placed in her heart in 2014 at  [facility], she is unable to tell if this pain is similar to that.   ED Course: Blood pressure 120s to 160s.  Heart rate 60s to 70s. Troponin 5 > 5.  Chest  x-ray clear.  EKG without use, shows old left bundle branch block. Hospitalist to admit for chest pain.   **Interim History  Cardiology was consulted for the patient's chest pain and recommended obtaining echocardiogram.  Her beta-blocker was held due to her Josue Hector and sinus bradycardia.  Patient is ruled out for MI and echo shows normal left ventricular function however heart rates are still trending low beta-blocker has been discontinued and stress test has not been ordered.  Her blood pressure is not controlled though and so cardiology has added amlodipine and will increase today.  They have not ordered a stress test currently and we will see how she is doing in the morning.  They are recommending to have the patient ambulate the patient improved and denies any chest pain.  Blood pressure still elevated but further adjustments were made to her blood pressure regimen and she is medically stable for discharge at this time and can follow-up with the PCP and cardiologist in outpatient setting and discuss with outpatient stress test with the cardiologist.  Assessment and Plan: * Chest pain rule out ACS, improving  -Chest pain improved with nitro.   -High risk for ACS with prior PCI with stents in 2014 at [facility].   -Troponin 5x2.   -EKG with old LBBB and first-degree heart block. -Per care everywhere, 03/2013- diagnostic cardiac catheterization and was found to have a high-grade 95% calcified eccentric stenosis of the proximal RCA, with calcified shelf of ostium. The  patient was treated with a drug-eluting stent. -Was initially NPO now on a Diet  -Cardiology has repeated an echocardiogram which showed indeterminate  LVDP and LVEF of 555-60% and has moderate Concentric LV Hypertrophy -Received Aspirin 324 mg once, continue home 81 mg daily -Metoprolol stopped -C/w Rosuvastatin 10 mg po Daily and Imdur started -C/w NTG SL 0.4 mg q50min x3 PRN -Further care per cardiology and she has been  started on Imdur; cardiology recommending also ambulating for now and working on getting blood pressure under better control -Cardiology feels that she is not an ideal candidate for Lexiscan Myoview this morning given her possible episodes of second-degree type II block -Cardiology feels that she can have an outpatient stress test -Chest discomfort is improved and she is medically stable for discharge at this time   Sinus bradycardia with first-degree AV block with intermittent type I second-degree AV block -Her beta-blocker has been discontinued and cardiology allowing for washout and can be followed in the outpatient setting -Echocardiogram being repeated as below   Diabetes ([place]) -Not on medication. -Check hemoglobin A1c in outpatient setting -CBGs fasting daily ranging from 138-153   Essential hypertension, benign -Blood Pressure 120s to 160s. -See above about Metoprolol being now discontinued given her bradycardia -Valsartan 300 mg po Daily being substituted by hospital Irbesartan 300 mg po Daily -Discussed continued clonidine 0.1 mg po BID  -Initiated on Imdur 30 mg p.o. daily and will continue -Will D/C HCTZ for now in case she goes for Cath but can resume at discharge -Also placed on amlodipine and uptitrated to 10 mg -Cardiology added amlodipine yesterday and given that her blood pressure still remain elevated they are uptitrating it today -Blood pressure was elevated at 179/58 but improved to 158/72 -Further care per cardiology outpatient setting   CKD Stage 3a -Patient's BUN/Cr improved from admission and is now 25/1.07 and is now improved to 24/1.04 -Avoid further Nephrotoxic Medications, Contrast Dyes, Hypotension to ensure adequate Renal perfusion -Will need to Renally adjust medications -Repeat CMP within 1 week   HLD -C/w Rosuvastatin 10 mg po Daily  -Cardiology is checking a fasting lipid panel and it showed a total cholesterol/HDL ratio of 3.3, cholesterol of 129,  HDL 39, LDL 60, triglycerides 151, VLDL 30   Hypoalbuminemia -The patient's albumin level is now 3.4 yesterday and today is now 3.6 -Continue to monitor and trend and repeat CMP within a week   Hypothyroidism -C/w Levothyroxine 75 mcg po Daily -TSH was 1.683   Normocytic Anemia -Patient's Hgb/Hct went from 11.6/35.2 -> 10.4/31.6 and improved to 11.3/34.4 the time of discharge -Check Anemia Panel in the outpatient setting -Continue to Monitor for S/Sx of Bleeding; No overt bleeding noted -Repeat CBC with 1 week   GERD/GI Prophylaxis -C/w Pantoprazole 40 mg po Daily   Obesity -Complicates overall prognosis and care -Estimated body mass index is 37.14 kg/m as calculated from the following:   Height as of this encounter: 5\' 2"  (1.575 m).   Weight as of this encounter: 92.1 kg.  -Weight Loss and Dietary Counseling given  Consultants: Cardiology  Procedures performed: ECHOCARDIOGRAM  Disposition: Home Diet recommendation:  Discharge Diet Orders (From admission, onward)     Start     Ordered   08/14/22 0000  Diet - low sodium heart healthy        08/14/22 1434           Cardiac and Carb modified diet DISCHARGE MEDICATION: Allergies as of 08/14/2022  Reactions   Fish Oil Hives   Oxycontin [oxycodone Hcl] Other (See Comments)   "made me crazy"        Medication List     STOP taking these medications    cloNIDine 0.1 MG tablet Commonly known as: CATAPRES   metoprolol tartrate 25 MG tablet Commonly known as: LOPRESSOR       TAKE these medications    acetaminophen 325 MG tablet Commonly known as: TYLENOL Take 2 tablets (650 mg total) by mouth every 6 (six) hours as needed for mild pain (or Fever >/= 101).   amLODipine 10 MG tablet Commonly known as: NORVASC Take 1 tablet (10 mg total) by mouth daily. Start taking on: August 15, 2022   ascorbic acid 500 MG tablet Commonly known as: VITAMIN C Take 500 mg by mouth daily.   ascorbic acid 500  MG tablet Commonly known as: VITAMIN C Take 500 mg by mouth daily.   aspirin 81 MG tablet Take 81 mg by mouth daily.   isosorbide mononitrate 30 MG 24 hr tablet Commonly known as: IMDUR Take 1 tablet (30 mg total) by mouth daily. Start taking on: August 15, 2022   levothyroxine 75 MCG tablet Commonly known as: SYNTHROID Take 75 mcg by mouth daily before breakfast.   MULTIVITAMIN PO Take by mouth.   nitroGLYCERIN 0.4 MG SL tablet Commonly known as: NITROSTAT Place 1 tablet (0.4 mg total) under the tongue every 5 (five) minutes x 3 doses as needed (if no relief after 2nd dose, proceed to ED for evaluation). Reported on 01/07/2016   ondansetron 4 MG tablet Commonly known as: ZOFRAN Take 1 tablet (4 mg total) by mouth every 6 (six) hours as needed for nausea.   pantoprazole 40 MG tablet Commonly known as: PROTONIX Take 40 mg by mouth daily.   polyethylene glycol 17 g packet Commonly known as: MIRALAX / GLYCOLAX Take 17 g by mouth daily as needed for mild constipation.   PRESERVISION AREDS 2+MULTI VIT PO Take 1 tablet by mouth daily.   rosuvastatin 10 MG tablet Commonly known as: CRESTOR Take 1 tablet (10 mg total) by mouth daily.   valsartan-hydrochlorothiazide 320-25 MG tablet Commonly known as: DIOVAN-HCT TAKE 1 TABLET BY MOUTH ONCE A DAY.   vitamin B-12 100 MCG tablet Commonly known as: CYANOCOBALAMIN Take 100 mcg by mouth daily.   Vitamin D3 125 MCG (5000 UT) Caps Take by mouth.   Zinc 100 MG Tabs Take 1 tablet by mouth daily.        Follow-up Information     Jonelle SidleMcDowell, Samuel G, MD Follow up on 08/31/2022.   Specialty: Cardiology Why: [facility] Follow-up on 08/31/2022 at 10:40 AM. Contact information: [address] [place] [address] [phone]         Kirstie PeriShah, Ashish, MD. Call.   Specialty: Internal Medicine Why: Follow up within 1-2 weeks for Post-Hospital Evaluation Contact information: [address]  [place]  [id number] [phone]                Discharge Exam: Ceasar MonsFiled Weights   08/11/22 2008 08/11/22 2239  Weight: 92.1 kg 92.1 kg   Vitals:   08/14/22 0452 08/14/22 1222  BP: (!) 179/58 (!) 158/72  Pulse: 81 84  Resp: 20 18  Temp: 97.8 F (36.6 C) 98.2 F (36.8 C)  SpO2: 96% 97%   Examination: Physical Exam:  Constitutional: WN/WD obese Caucasian female in NAD appears calm and comfortable  Respiratory: Diminished to auscultation bilaterally,  no wheezing, rales, rhonchi or crackles. Normal respiratory effort and patient is not tachypenic. No accessory muscle use. Unlabored breathing  Cardiovascular: RRR, no murmurs / rubs / gallops. S1 and S2 auscultated. Mild LE edema  Abdomen: Soft, non-tender, Distended 2/2 body habitus. Bowel sounds positive.  GU: Deferred. Musculoskeletal: No clubbing / cyanosis of digits/nails. No joint deformity upper and lower extremities.  Skin: No rashes, lesions, ulcers on a limited skin evaluation. No induration; Warm and dry.  Neurologic: CN 2-12 grossly intact with no focal deficits. Romberg sign and cerebellar reflexes not assessed.  Psychiatric: Normal judgment and insight. Alert and oriented x 3. Normal mood and appropriate affect.   Condition at discharge: stable  The results of significant diagnostics from this hospitalization (including imaging, microbiology, ancillary and laboratory) are listed below for reference.   Imaging Studies: ECHOCARDIOGRAM COMPLETE  Result Date: 08/12/2022    ECHOCARDIOGRAM REPORT   Patient Name:   SHIRELL STRUTHERS Malkiewicz Date of Exam: 08/12/2022 Medical Rec #:  [id number]     Height:       62.0 in Accession #:    [id number]    Weight:       203.0 lb Date of Birth:  1928/12/10     BSA:          1.924 m Patient Age:    88 years      BP:           147/43 mmHg Patient Gender: F             HR:           66 bpm. [facility] Procedure: 2D Echo, Cardiac Doppler, Color Doppler and Intracardiac            Opacification  Agent Indications:    Chest pain  History:        Patient has prior history of Echocardiogram examinations, most                 recent 12/24/2009. Signs/Symptoms:Chest Pain; Risk                 Factors:Hypertension and Diabetes.  Sonographer:    Mikki Harbor Referring Phys: [id number] Ellsworth Lennox IMPRESSIONS  1. Septal motion consistent with conduction delay. Left ventricular ejection fraction, by estimation, is 55 to 60%. The left ventricle has normal function. There is moderate concentric left ventricular hypertrophy. Left ventricular diastolic parameters are indeterminate.  2. Right ventricular systolic function is normal. The right ventricular size is normal. There is normal pulmonary artery systolic pressure.  3. Trivial mitral valve regurgitation.  4. The aortic valve is tricuspid. Aortic valve regurgitation is not visualized. Aortic valve sclerosis/calcification is present, without any evidence of aortic stenosis.  5. The inferior vena cava is normal in size with greater than 50% respiratory variability, suggesting right atrial pressure of 3 mmHg. FINDINGS  Left Ventricle: Septal motion consistent with conduction delay. Left ventricular ejection fraction, by estimation, is 55 to 60%. The left ventricle has normal function. Definity contrast agent was given IV to delineate the left ventricular endocardial borders. The left ventricular internal cavity size was normal in size. There is moderate concentric left ventricular hypertrophy. Left ventricular diastolic parameters are indeterminate. Right Ventricle: The right ventricular size is normal. Right vetricular wall thickness was not assessed. Right ventricular systolic function is normal. There is normal pulmonary artery systolic pressure. The tricuspid regurgitant velocity is 2.58 m/s, and with an assumed right atrial pressure of 3 mmHg,  the estimated right ventricular systolic pressure is 29.6 mmHg. Left Atrium: Left atrial size was normal in size.  Right Atrium: Right atrial size was normal in size. Pericardium: There is no evidence of pericardial effusion. Mitral Valve: There is mild thickening of the mitral valve leaflet(s). Mild mitral annular calcification. Trivial mitral valve regurgitation. MV peak gradient, 3.4 mmHg. The mean mitral valve gradient is 1.0 mmHg. Tricuspid Valve: The tricuspid valve is normal in structure. Tricuspid valve regurgitation is trivial. Aortic Valve: The aortic valve is tricuspid. Aortic valve regurgitation is not visualized. Aortic valve sclerosis/calcification is present, without any evidence of aortic stenosis. Aortic valve mean gradient measures 9.0 mmHg. Aortic valve peak gradient measures 14.9 mmHg. Aortic valve area, by VTI measures 2.32 cm. Pulmonic Valve: The pulmonic valve was normal in structure. Pulmonic valve regurgitation is mild. Aorta: The aortic root and ascending aorta are structurally normal, with no evidence of dilitation. Venous: The inferior vena cava is normal in size with greater than 50% respiratory variability, suggesting right atrial pressure of 3 mmHg. IAS/Shunts: No atrial level shunt detected by color flow Doppler.  LEFT VENTRICLE PLAX 2D LVIDd:         4.40 cm     Diastology LVIDs:         3.10 cm     LV e' medial:    4.57 cm/s LV PW:         1.60 cm     LV E/e' medial:  12.2 LV IVS:        1.50 cm     LV e' lateral:   5.55 cm/s LVOT diam:     2.00 cm     LV E/e' lateral: 10.0 LV SV:         114 LV SV Index:   59 LVOT Area:     3.14 cm  LV Volumes (MOD) LV vol d, MOD A4C: 45.1 ml LV vol s, MOD A4C: 17.9 ml LV SV MOD A4C:     45.1 ml RIGHT VENTRICLE RV Basal diam:  3.60 cm RV Mid diam:    2.90 cm RV S prime:     9.14 cm/s TAPSE (M-mode): 2.2 cm LEFT ATRIUM             Index        RIGHT ATRIUM           Index LA diam:        4.40 cm 2.29 cm/m   RA Area:     19.40 cm LA Vol (A2C):   58.1 ml 30.20 ml/m  RA Volume:   55.70 ml  28.95 ml/m LA Vol (A4C):   56.4 ml 29.31 ml/m LA Biplane Vol: 59.8 ml  31.08 ml/m  AORTIC VALVE                     PULMONIC VALVE AV Area (Vmax):    2.46 cm      PV Vmax:       1.30 m/s AV Area (Vmean):   2.33 cm      PV Peak grad:  6.8 mmHg AV Area (VTI):     2.32 cm AV Vmax:           193.00 cm/s AV Vmean:          140.000 cm/s AV VTI:            0.491 m AV Peak Grad:      14.9 mmHg AV Mean Grad:  9.0 mmHg LVOT Vmax:         151.00 cm/s LVOT Vmean:        104.000 cm/s LVOT VTI:          0.362 m LVOT/AV VTI ratio: 0.74  AORTA Ao Root diam: 3.40 cm Ao Asc diam:  3.30 cm MITRAL VALVE                TRICUSPID VALVE MV Area (PHT): 3.40 cm     TR Peak grad:   26.6 mmHg MV Area VTI:   4.39 cm     TR Vmax:        258.00 cm/s MV Peak grad:  3.4 mmHg MV Mean grad:  1.0 mmHg     SHUNTS MV Vmax:       0.92 m/s     Systemic VTI:  0.36 m MV Vmean:      54.3 cm/s    Systemic Diam: 2.00 cm MV Decel Time: 223 msec MV E velocity: 55.70 cm/s MV A velocity: 104.00 cm/s MV E/A ratio:  0.54 Dietrich Pates MD Electronically signed by Dietrich Pates MD Signature Date/Time: 08/12/2022/4:01:09 PM    Final    DG Chest Port 1 View  Result Date: 08/11/2022 CLINICAL DATA:  Chest pain.  Chest heaviness, shortness of breath. EXAM: PORTABLE CHEST 1 VIEW COMPARISON:  Radiograph 11/10/2020 FINDINGS: Mild cardiomegaly. Stable mediastinal contours with aortic atherosclerosis. No focal airspace disease, pulmonary edema, pleural effusion or pneumothorax. Chronic left shoulder arthropathy. IMPRESSION: Unchanged mild cardiomegaly. No acute chest findings. Electronically Signed   By: Narda Rutherford M.D.   On: 08/11/2022 15:07    Microbiology: No results found for this or any previous visit.  Labs: CBC: Recent Labs  Lab 08/11/22 1441 08/12/22 0457 08/13/22 0248 08/14/22 0539 08/14/22 0949  WBC 6.5 5.4 6.5 6.5 5.7  NEUTROABS  --   --  4.5 4.3 4.0  HGB 11.6* 10.4* 10.6* 11.0* 11.3*  HCT 35.2* 31.6* 32.4* 33.7* 34.4*  MCV 91.2 91.9 91.0 91.8 91.2  PLT 222 200 187 207 198   Basic Metabolic  Panel: Recent Labs  Lab 08/11/22 1441 08/12/22 0457 08/13/22 0248 08/14/22 0539  NA 139 139 140 141  K 4.0 3.7 3.7 3.8  CL 106 106 105 106  CO2 GLUCOSE 153* 138* 140* 128*  BUN 29* 25* 26* 24*  CREATININE 1.22* 1.07* 1.05* 1.04*  CALCIUM 9.6 9.2 9.2 9.3  MG  --   --  2.2 2.0  PHOS  --   --  3.7 3.4   Liver Function Tests: Recent Labs  Lab 08/13/22 0248 08/14/22 0539  AST 18 17  ALT 16 14  ALKPHOS 56 57  BILITOT 0.7 0.7  PROT 5.9* 6.3*  ALBUMIN 3.4* 3.6   CBG: No results for input(s): "GLUCAP" in the last 168 hours.  Discharge time spent: greater than 30 minutes.  Signed: Marguerita Merles, DO [facility] 08/14/2022

## 2022-08-20 ENCOUNTER — Other Ambulatory Visit: Payer: Self-pay

## 2022-08-20 ENCOUNTER — Observation Stay (HOSPITAL_COMMUNITY)
Admission: EM | Admit: 2022-08-20 | Discharge: 2022-08-23 | Disposition: A | Discharge status: Home / Self Care | Payer: Medicare Other | Attending: Internal Medicine | Admitting: Internal Medicine

## 2022-08-20 ENCOUNTER — Observation Stay (HOSPITAL_COMMUNITY): Payer: Medicare Other

## 2022-08-20 ENCOUNTER — Emergency Department (HOSPITAL_COMMUNITY): Payer: Medicare Other

## 2022-08-20 ENCOUNTER — Encounter (HOSPITAL_COMMUNITY): Payer: Self-pay | Admitting: Emergency Medicine

## 2022-08-20 DIAGNOSIS — R944 Abnormal results of kidney function studies: Secondary | ICD-10-CM | POA: Diagnosis not present

## 2022-08-20 DIAGNOSIS — N1832 Chronic kidney disease, stage 3b: Secondary | ICD-10-CM | POA: Diagnosis not present

## 2022-08-20 DIAGNOSIS — Z8679 Personal history of other diseases of the circulatory system: Secondary | ICD-10-CM

## 2022-08-20 DIAGNOSIS — E039 Hypothyroidism, unspecified: Secondary | ICD-10-CM | POA: Diagnosis not present

## 2022-08-20 DIAGNOSIS — Z955 Presence of coronary angioplasty implant and graft: Secondary | ICD-10-CM | POA: Insufficient documentation

## 2022-08-20 DIAGNOSIS — M79662 Pain in left lower leg: Secondary | ICD-10-CM

## 2022-08-20 DIAGNOSIS — I441 Atrioventricular block, second degree: Secondary | ICD-10-CM

## 2022-08-20 DIAGNOSIS — I2510 Atherosclerotic heart disease of native coronary artery without angina pectoris: Secondary | ICD-10-CM | POA: Diagnosis not present

## 2022-08-20 DIAGNOSIS — E782 Mixed hyperlipidemia: Secondary | ICD-10-CM

## 2022-08-20 DIAGNOSIS — N179 Acute kidney failure, unspecified: Secondary | ICD-10-CM | POA: Insufficient documentation

## 2022-08-20 DIAGNOSIS — I10 Essential (primary) hypertension: Secondary | ICD-10-CM | POA: Diagnosis not present

## 2022-08-20 DIAGNOSIS — E1122 Type 2 diabetes mellitus with diabetic chronic kidney disease: Secondary | ICD-10-CM | POA: Diagnosis not present

## 2022-08-20 DIAGNOSIS — Z79899 Other long term (current) drug therapy: Secondary | ICD-10-CM | POA: Insufficient documentation

## 2022-08-20 DIAGNOSIS — I25110 Atherosclerotic heart disease of native coronary artery with unstable angina pectoris: Secondary | ICD-10-CM | POA: Diagnosis not present

## 2022-08-20 DIAGNOSIS — R0789 Other chest pain: Secondary | ICD-10-CM | POA: Diagnosis not present

## 2022-08-20 DIAGNOSIS — E785 Hyperlipidemia, unspecified: Secondary | ICD-10-CM

## 2022-08-20 DIAGNOSIS — D649 Anemia, unspecified: Secondary | ICD-10-CM | POA: Diagnosis not present

## 2022-08-20 DIAGNOSIS — R011 Cardiac murmur, unspecified: Secondary | ICD-10-CM | POA: Insufficient documentation

## 2022-08-20 DIAGNOSIS — K219 Gastro-esophageal reflux disease without esophagitis: Secondary | ICD-10-CM

## 2022-08-20 DIAGNOSIS — R079 Chest pain, unspecified: Secondary | ICD-10-CM | POA: Diagnosis not present

## 2022-08-20 DIAGNOSIS — Z7982 Long term (current) use of aspirin: Secondary | ICD-10-CM | POA: Insufficient documentation

## 2022-08-20 DIAGNOSIS — I159 Secondary hypertension, unspecified: Secondary | ICD-10-CM

## 2022-08-20 DIAGNOSIS — R7989 Other specified abnormal findings of blood chemistry: Secondary | ICD-10-CM

## 2022-08-20 DIAGNOSIS — I129 Hypertensive chronic kidney disease with stage 1 through stage 4 chronic kidney disease, or unspecified chronic kidney disease: Secondary | ICD-10-CM | POA: Insufficient documentation

## 2022-08-20 DIAGNOSIS — I2583 Coronary atherosclerosis due to lipid rich plaque: Secondary | ICD-10-CM

## 2022-08-20 LAB — CBC WITH DIFFERENTIAL/PLATELET
Abs Immature Granulocytes: 0.02 10*3/uL (ref 0.00–0.07)
Basophils Absolute: 0 10*3/uL (ref 0.0–0.1)
Basophils Relative: 0 %
Eosinophils Absolute: 0 10*3/uL (ref 0.0–0.5)
Eosinophils Relative: 1 %
HCT: 33.4 % — ABNORMAL LOW (ref 36.0–46.0)
Hemoglobin: 10.8 g/dL — ABNORMAL LOW (ref 12.0–15.0)
Immature Granulocytes: 0 %
Lymphocytes Relative: 21 %
Lymphs Abs: 1.6 10*3/uL (ref 0.7–4.0)
MCH: 29.6 pg (ref 26.0–34.0)
MCHC: 32.3 g/dL (ref 30.0–36.0)
MCV: 91.5 fL (ref 80.0–100.0)
Monocytes Absolute: 0.5 10*3/uL (ref 0.1–1.0)
Monocytes Relative: 6 %
Neutro Abs: 5.5 10*3/uL (ref 1.7–7.7)
Neutrophils Relative %: 72 %
Platelets: 217 10*3/uL (ref 150–400)
RBC: 3.65 MIL/uL — ABNORMAL LOW (ref 3.87–5.11)
RDW: 13.3 % (ref 11.5–15.5)
WBC: 7.7 10*3/uL (ref 4.0–10.5)
nRBC: 0 % (ref 0.0–0.2)

## 2022-08-20 LAB — COMPREHENSIVE METABOLIC PANEL
ALT: 18 U/L (ref 0–44)
AST: 22 U/L (ref 15–41)
Albumin: 3.9 g/dL (ref 3.5–5.0)
Alkaline Phosphatase: 71 U/L (ref 38–126)
Anion gap: 9 (ref 5–15)
BUN: 28 mg/dL — ABNORMAL HIGH (ref 8–23)
CO2: 26 mmol/L (ref 22–32)
Calcium: 9.5 mg/dL (ref 8.9–10.3)
Chloride: 104 mmol/L (ref 98–111)
Creatinine, Ser: 1.32 mg/dL — ABNORMAL HIGH (ref 0.44–1.00)
GFR, Estimated: 38 mL/min — ABNORMAL LOW (ref >60)
Glucose, Bld: 152 mg/dL — ABNORMAL HIGH (ref 70–99)
Potassium: 4.1 mmol/L (ref 3.5–5.1)
Sodium: 139 mmol/L (ref 135–145)
Total Bilirubin: 0.5 mg/dL (ref 0.3–1.2)
Total Protein: 6.8 g/dL (ref 6.5–8.1)

## 2022-08-20 LAB — TROPONIN I (HIGH SENSITIVITY)
Troponin I (High Sensitivity): 6 ng/L (ref <18)
Troponin I (High Sensitivity): 7 ng/L (ref <18)

## 2022-08-20 LAB — MAGNESIUM: Magnesium: 2.2 mg/dL (ref 1.7–2.4)

## 2022-08-20 MED ORDER — ONDANSETRON HCL 4 MG PO TABS: 4.0000 mg | ORAL_TABLET | Freq: Four times a day (QID) | ORAL | Status: DC | PRN

## 2022-08-20 MED ORDER — FAMOTIDINE 20 MG PO TABS
20.0000 mg | ORAL_TABLET | Freq: Every day | ORAL | Status: DC
  Administered 2022-08-20 – 2022-08-23 (×4): 20 mg via ORAL
  Filled 2022-08-20 (×4): qty 1

## 2022-08-20 MED ORDER — ONDANSETRON HCL 4 MG/2ML IJ SOLN: 4.0000 mg | Freq: Four times a day (QID) | INTRAMUSCULAR | Status: DC | PRN

## 2022-08-20 MED ORDER — ISOSORBIDE MONONITRATE ER 30 MG PO TB24: 30.0000 mg | ORAL_TABLET | Freq: Every day | ORAL | Status: DC

## 2022-08-20 MED ORDER — AMLODIPINE BESYLATE 5 MG PO TABS
10.0000 mg | ORAL_TABLET | Freq: Every day | ORAL | Status: DC
  Administered 2022-08-20 – 2022-08-23 (×4): 10 mg via ORAL
  Filled 2022-08-20 (×4): qty 2

## 2022-08-20 MED ORDER — ROSUVASTATIN CALCIUM 20 MG PO TABS
10.0000 mg | ORAL_TABLET | Freq: Every day | ORAL | Status: DC
  Administered 2022-08-20 – 2022-08-23 (×4): 10 mg via ORAL
  Filled 2022-08-20 (×4): qty 1

## 2022-08-20 MED ORDER — OXYCODONE HCL 5 MG PO TABS: 5.0000 mg | ORAL_TABLET | ORAL | Status: DC | PRN

## 2022-08-20 MED ORDER — ISOSORBIDE MONONITRATE ER 60 MG PO TB24
60.0000 mg | ORAL_TABLET | Freq: Every day | ORAL | Status: DC
  Administered 2022-08-20 – 2022-08-23 (×4): 60 mg via ORAL
  Filled 2022-08-20 (×4): qty 1

## 2022-08-20 MED ORDER — MORPHINE SULFATE (PF) 2 MG/ML IV SOLN: 2.0000 mg | INTRAVENOUS | Status: DC | PRN

## 2022-08-20 MED ORDER — LEVOTHYROXINE SODIUM 75 MCG PO TABS
75.0000 ug | ORAL_TABLET | Freq: Every day | ORAL | Status: DC
  Administered 2022-08-20 – 2022-08-23 (×4): 75 ug via ORAL
  Filled 2022-08-20 (×2): qty 1
  Filled 2022-08-20: qty 2
  Filled 2022-08-20: qty 1

## 2022-08-20 MED ORDER — HEPARIN SODIUM (PORCINE) 5000 UNIT/ML IJ SOLN
5000.0000 [IU] | Freq: Three times a day (TID) | INTRAMUSCULAR | Status: DC
  Administered 2022-08-20 – 2022-08-23 (×10): 5000 [IU] via SUBCUTANEOUS
  Filled 2022-08-20 (×10): qty 1

## 2022-08-20 MED ORDER — ACETAMINOPHEN 325 MG PO TABS: 650.0000 mg | ORAL_TABLET | Freq: Four times a day (QID) | ORAL | Status: DC | PRN

## 2022-08-20 MED ORDER — ASPIRIN 81 MG PO CHEW
81.0000 mg | CHEWABLE_TABLET | Freq: Every day | ORAL | Status: DC
  Administered 2022-08-20 – 2022-08-23 (×4): 81 mg via ORAL
  Filled 2022-08-20 (×4): qty 1

## 2022-08-20 MED ORDER — ACETAMINOPHEN 650 MG RE SUPP: 650.0000 mg | Freq: Four times a day (QID) | RECTAL | Status: DC | PRN

## 2022-08-20 NOTE — ED Notes (Signed)
Attempted to call report to [phone]. Receiving nurse requested to call back in a few minutes.

## 2022-08-20 NOTE — H&P (Signed)
History and Physical    Patient: Maria Durham [id number] DOB: [birth year] DOA: 08/20/2022 DOS: the patient was seen and examined on 08/20/2022 PCP: Monico Blitz, MD  Patient coming from: Home  Chief Complaint:  Chief Complaint  Patient presents with   Palpitations   HPI: Maria Durham is a 88 y.o. female with medical history significant of coronary artery disease, essential hypertension, hypothyroidism, GERD, type 2 diabetes mellitus-diet controlled, and more presents to the ED with a chief complaint of chest pain.  Patient reports it woke her up out of sleep around 1 AM.  She had nausea but no vomiting.  She felt tremulous during chest pain.  She denies diaphoresis, fever, lightheadedness.  She describes her chest pain as a heaviness or fullness in her chest.  Her daughter-in-law came over and checked her blood pressure which was 150s over 70s.  She told her daughter that her pain was 8-9 out of 10 so daughter gave her nitro which offered no relief.  She had a second nitro which gave some improvement.  Daughter tried to lay her back but patient felt more short of breath with laying back. They called EMS who advised to give 324 baby aspirin, which they did. Patient reports pain was improved, but still present upon arrival to the ED. At this time, the pain is resolved. Patient reports that through the whole thing, she really only felt dyspnea when she laid back. She has noticed 1+ pitting edema during the day in her lower extremities, but it goes away at night. Patient has no other complaints at this time. The pain is not reproducible with palpation or chest or epigastric region. Patient has no other complaints.   Review of Systems: As mentioned in the history of present illness. All other systems reviewed and are negative. Past Medical History:  Diagnosis Date   Coronary artery disease    DES to proximal RCA March 2014 [facility]   Essential hypertension    Obesity    BMI 35.4   Type 2  diabetes mellitus ([place])    Past Surgical History:  Procedure Laterality Date   CARDIAC CATHETERIZATION  2014   with stent placement    CHOLECYSTECTOMY     Social History:  reports that she has never smoked. She has never used smokeless tobacco. She reports that she does not drink alcohol and does not use drugs.  Allergies  Allergen Reactions   Fish Oil Hives   Oxycontin [Oxycodone Hcl] Other (See Comments)    "made me crazy"    Family History  Problem Relation Age of Onset   Diabetes Mother    Coronary artery disease Other     Prior to Admission medications   Medication Sig Start Date End Date Taking? Authorizing Provider  acetaminophen (TYLENOL) 325 MG tablet Take 2 tablets (650 mg total) by mouth every 6 (six) hours as needed for mild pain (or Fever >/= 101). 08/14/22   Sheikh, Omair Latif, DO  amLODipine (NORVASC) 10 MG tablet Take 1 tablet (10 mg total) by mouth daily. 08/15/22   Raiford Noble Latif, DO  ascorbic acid (VITAMIN C) 500 MG tablet Take 500 mg by mouth daily.    [provider]  aspirin 81 MG tablet Take 81 mg by mouth daily.    [provider]  Cholecalciferol (VITAMIN D3) 125 MCG (5000 UT) CAPS Take by mouth.    [provider]  isosorbide mononitrate (IMDUR) 30 MG 24 hr tablet Take 1 tablet (30  mg total) by mouth daily. 08/15/22   Raiford Noble Latif, DO  levothyroxine (SYNTHROID) 75 MCG tablet Take 75 mcg by mouth daily before breakfast.    [provider]  Multiple Vitamins-Minerals (MULTIVITAMIN PO) Take by mouth.    [provider]  Multiple Vitamins-Minerals (PRESERVISION AREDS 2+MULTI VIT PO) Take 1 tablet by mouth daily.    [provider]  nitroGLYCERIN (NITROSTAT) 0.4 MG SL tablet Place 1 tablet (0.4 mg total) under the tongue every 5 (five) minutes x 3 doses as needed (if no relief after 2nd dose, proceed to ED for evaluation). Reported on 01/07/2016 11/11/21   Satira Sark, MD  ondansetron  (ZOFRAN) 4 MG tablet Take 1 tablet (4 mg total) by mouth every 6 (six) hours as needed for nausea. 08/14/22   Raiford Noble Latif, DO  pantoprazole (PROTONIX) 40 MG tablet Take 40 mg by mouth daily.    [provider]  polyethylene glycol (MIRALAX / GLYCOLAX) 17 g packet Take 17 g by mouth daily as needed for mild constipation. 08/14/22   Raiford Noble Latif, DO  rosuvastatin (CRESTOR) 10 MG tablet Take 1 tablet (10 mg total) by mouth daily. 11/11/21   Satira Sark, MD  valsartan-hydrochlorothiazide (DIOVAN-HCT) 320-25 MG tablet TAKE 1 TABLET BY MOUTH ONCE A DAY. 03/30/22   Satira Sark, MD  vitamin B-12 (CYANOCOBALAMIN) 100 MCG tablet Take 100 mcg by mouth daily.    [provider]  vitamin C (ASCORBIC ACID) 500 MG tablet Take 500 mg by mouth daily.    [provider]  Zinc 100 MG TABS Take 1 tablet by mouth daily.    [provider]    Physical Exam: Vitals:   08/20/22 0250 08/20/22 0251 08/20/22 0330 08/20/22 0400  BP:  131/61 (!) 151/62 (!) 139/51  Pulse:  82 75 71  Resp:  18 15 15   Temp:      TempSrc:      SpO2:  99% 97% 95%  Weight: 91.6 kg      1.  General: Patient lying supine in bed,  no acute distress   2. Psychiatric: Alert and oriented x 3, mood and behavior normal for situation, pleasant and cooperative with exam   3. Neurologic: Speech and language are normal, face is symmetric, moves all 4 extremities voluntarily, at baseline without acute deficits on limited exam   4. HEENMT:  Head is atraumatic, normocephalic, pupils reactive to light, neck is supple, trachea is midline, mucous membranes are moist   5. Respiratory : Lungs are clear to auscultation bilaterally without wheezing, rhonchi, rales, no cyanosis, no increase in work of breathing or accessory muscle use   6. Cardiovascular : Heart rate normal, rhythm is bradycardic, no murmurs, rubs or gallops, trace peripheral edema, peripheral pulses palpated   7.  Gastrointestinal:  Abdomen is soft, nondistended, nontender to palpation bowel sounds active, no masses or organomegaly palpated   8. Skin:  Skin is warm, dry and intact without rashes, acute lesions, or ulcers on limited exam   9.Musculoskeletal:  No acute deformities or trauma, no asymmetry in tone, trace peripheral edema, peripheral pulses palpated, no tenderness to palpation in the extremities  Data Reviewed: T 97.9, HR 71 - 82, R 15-18, BP 131/51 - 151/62 95-99% No leukocytosis, hgb stable Chemistry reveals bump in creatinine CXR - stable cardiomegaly EKG = no acute st changes Tele still showing dropped beats Trop 6  History of CAD with stent placed in 2014 Similar to last presentation  Cardiology advises avoidance of beta blocker from last admission Patient has not yet been able to get her outpatient stress test  Assessment and Plan: * Chest pain - Woke her up out of sleep - Some improvement after second nitro and 4 baby aspirin - No acute ST changes on EKG - Chest x-ray shows stable cardiomegaly - Initial troponin 6, repeat troponin pending - EKG shows a heart rate of 79, sinus rhythm, QTc 498 - Patient has a history of stent placement in 2014 - She was just in the hospital for the same presentation a week ago and was advised to have an outpatient stress test but has not been able to have an appointment with cardiology yet-scheduled for October 10 - Monitor on telemetry   Elevated serum creatinine - Creatinine 1.32 - Last week creatinine 1.04 - This does not meet criteria for an AKI but is certainly elevated in comparison - Hold ARB and thiazide - Encourage p.o. intake of liquids - Continue to monitor  CAD (coronary artery disease) -Chest pain with some relief after 2 nitro and 4 baby aspirin - No acute ST changes on EKG - Patient does have what appears to be second-degree AV block - Consult cardiology - Continue aspirin, Imdur, Crestor - Holding ARB in the  setting of elevated creatinine - Continue to monitor  Pain of left calf - Concern for DVT - Ultrasound to rule out DVT  HLD (hyperlipidemia) - Continue statin  Hypothyroidism - Continue Synthroid - Last TSH was within normal limits  GERD (gastroesophageal reflux disease) - Continue Pepcid  HTN (hypertension) - Currently holding ARB and hydrochlorothiazide - Continue amlodipine - Continue to monitor      Advance Care Planning:   Code Status: Full Code   Consults: cards  Family Communication: daughter in law at bedside  Severity of Illness: The appropriate patient status for this patient is INPATIENT. Inpatient status is judged to be reasonable and necessary in order to provide the required intensity of service to ensure the patient's safety. The patient's presenting symptoms, physical exam findings, and initial radiographic and laboratory data in the context of their chronic comorbidities is felt to place them at high risk for further clinical deterioration. Furthermore, it is not anticipated that the patient will be medically stable for discharge from the hospital within 2 midnights of admission.   * I certify that at the point of admission it is my clinical judgment that the patient will require inpatient hospital care spanning beyond 2 midnights from the point of admission due to high intensity of service, high risk for further deterioration and high frequency of surveillance required.*  Author: Rolla Plate, DO 08/20/2022 6:43 AM  For on call review [email or web address].

## 2022-08-20 NOTE — Assessment & Plan Note (Signed)
-  Chest pain with some relief after 2 nitro and 4 baby aspirin - No acute ST changes on EKG - Patient does have what appears to be second-degree AV block - Consult cardiology - Continue aspirin, Imdur, Crestor - Holding ARB in the setting of elevated creatinine - Continue to monitor

## 2022-08-20 NOTE — Assessment & Plan Note (Signed)
-   Woke her up out of sleep - Some improvement after second nitro and 4 baby aspirin - No acute ST changes on EKG - Chest x-ray shows stable cardiomegaly - Initial troponin 6, repeat troponin pending - EKG shows a heart rate of 79, sinus rhythm, QTc 498 - Patient has a history of stent placement in 2014 - She was just in the hospital for the same presentation a week ago and was advised to have an outpatient stress test but has not been able to have an appointment with cardiology yet-scheduled for October 10 - Monitor on telemetry

## 2022-08-20 NOTE — Assessment & Plan Note (Signed)
-   Currently holding ARB and hydrochlorothiazide - Continue amlodipine - Continue to monitor

## 2022-08-20 NOTE — Assessment & Plan Note (Signed)
-   Creatinine 1.32 - Last week creatinine 1.04 - This does not meet criteria for an AKI but is certainly elevated in comparison - Hold ARB and thiazide - Encourage p.o. intake of liquids - Continue to monitor

## 2022-08-20 NOTE — Assessment & Plan Note (Signed)
Continue Pepcid  

## 2022-08-20 NOTE — ED Notes (Signed)
Breakfast tray given to pt 

## 2022-08-20 NOTE — Progress Notes (Signed)
Brief same day note:   Patient is a 88 year old female with history of coronary artery disease, hypertension, hypothyroidism, GERD, diabetes type 2 who presented with chest pain , palpitations from home.  Described the chest pain as heaviness or fullness.  She was just admitted and discharged on 9/23 for the same and recommended outpatient stress test. She was admitted for further management. Patient seen and examined at bedside this morning in the emergency department.  Family around her.  She still has some chest heaviness.  She wants stress test to be done before she leaves.  Assessment and plan:  Chest pain: Presented with chest pain again.  Was discharged on 9/23 when she presented with the same complaint and was planned  for outpatient stress test.  Chest pain improved with nitroglycerin.  No ST changes on the EKG.  Chest x-ray did not show any acute findings.  Troponins have been negative. Cardiology consulted.  Monitor on telemetry.  Plan for stress test on Monday. Last echo done on 08/12/2022 showed EF of 55 to 60%, moderate concentric left ventricular hypertrophy. Dose of Imdur increased.Continue aspirin  History of coronary artery disease: Status post PCI with stent placement in 2014.   Continue aspirin, Imdur, Crestor.  ARB on hold for elevated creatinine  AKI: Thiazide, ARB on hold.  Continue to monitor.  Last creatinine was 1.2 on 9/20.Check BMP tomorrow  Left calf pain: venous Doppler negative for  DVT  Hypothyroidism: Continue Synthyroid  GERD: On Pepcid  Hypertension: Monitor blood pressure.  Continue amlodipine.  Home ARB, hydrochlorothiazide on hold

## 2022-08-20 NOTE — Assessment & Plan Note (Signed)
-   Concern for DVT - Ultrasound to rule out DVT

## 2022-08-20 NOTE — ED Triage Notes (Signed)
Pt in from home via [facility], c/o waking up and "just not feeling right". States she began having palpitations, and the family administered 2 NTG tabs and 324mg  ASA. EMS states controlled Afib en route. Pt arrives with feeling of "fullness in chest", does not endorse cp at this time.

## 2022-08-20 NOTE — Consult Note (Signed)
Cardiology Consult:   Patient ID: Maria Durham; MRN: [id number]; DOB: 1929/10/15   Admission date: 08/20/2022  Primary Care Provider: Kirstie Peri, MD Primary Cardiologist: Dr. Diona Browner  Chief Complaint:  chest pain  Patient Profile:   Maria Durham is a 88 y.o. 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 was last feeling well in 10/2021- seen by Dr. Durenda Hurt without CP or DOE. She is able to go to church and walk to [employer], she is sedentary that may be average when accounting for age.  08/12/22 She had increased in her blood pressure and new intermittent CP.  Found to have notable bradycardia.  Found to have elevated blood pressure. She has a known LBBB that was re-demonstrated on admission. She was having Mobtiz Type one heart block and her BB was held. No WMA on recent echo.   Early AM had new chest pain with chest heaviness that woke her from sleep.  Pain has improve through her time in the ED but was still present at time of ED eval. Troponin normal.  Presently patient feels that her chest pain has resolved.  Would like to go home tonight. Family is concerned, as she has DOE with minimal exertion.  Worried she would have this happen again. She had drank coffee this morning and ate a blueberry muffin.  She would not able to walk on a treadmill due to deconditioning. She remembers her last LHC was because of positive symptoms.  She has some symptoms while working at [employer] In 2014 but does not remember what they were.   Past Medical History:  Diagnosis Date   Coronary artery disease    DES to proximal RCA March 2014 [facility]   Essential hypertension    Obesity    BMI 35.4   Type 2 diabetes mellitus (HCC)     Past Surgical History:  Procedure Laterality Date   CARDIAC CATHETERIZATION  2014   with  stent placement    CHOLECYSTECTOMY       Medications Prior to Admission: Prior to Admission medications   Medication Sig Start Date End Date Taking? Authorizing Provider  acetaminophen (TYLENOL) 325 MG tablet Take 2 tablets (650 mg total) by mouth every 6 (six) hours as needed for mild pain (or Fever >/= 101). 08/14/22   Sheikh, Omair Latif, DO  amLODipine (NORVASC) 10 MG tablet Take 1 tablet (10 mg total) by mouth daily. 08/15/22   Marguerita Merles Latif, DO  ascorbic acid (VITAMIN C) 500 MG tablet Take 500 mg by mouth daily.    [provider]  aspirin 81 MG tablet Take 81 mg by mouth daily.    [provider]  Cholecalciferol (VITAMIN D3) 125 MCG (5000 UT) CAPS Take by mouth.    [provider]  isosorbide mononitrate (IMDUR) 30 MG 24 hr tablet Take 1 tablet (30 mg total) by mouth daily. 08/15/22   Marguerita Merles Latif, DO  levothyroxine (SYNTHROID) 75 MCG tablet Take 75 mcg by mouth daily before breakfast.    [provider]  Multiple Vitamins-Minerals (MULTIVITAMIN PO) Take by mouth.    [provider]  Multiple Vitamins-Minerals (PRESERVISION AREDS 2+MULTI VIT PO) Take 1 tablet by mouth daily.    [provider]  nitroGLYCERIN (NITROSTAT) 0.4 MG SL tablet Place 1 tablet (  0.4 mg total) under the tongue every 5 (five) minutes x 3 doses as needed (if no relief after 2nd dose, proceed to ED for evaluation). Reported on 01/07/2016 11/11/21   Jonelle Sidle, MD  ondansetron (ZOFRAN) 4 MG tablet Take 1 tablet (4 mg total) by mouth every 6 (six) hours as needed for nausea. 08/14/22   Marguerita Merles Latif, DO  pantoprazole (PROTONIX) 40 MG tablet Take 40 mg by mouth daily.    [provider]  polyethylene glycol (MIRALAX / GLYCOLAX) 17 g packet Take 17 g by mouth daily as needed for mild constipation. 08/14/22   Marguerita Merles Latif, DO  rosuvastatin (CRESTOR) 10 MG tablet Take 1 tablet (10 mg total) by mouth daily. 11/11/21   Jonelle Sidle, MD  valsartan-hydrochlorothiazide (DIOVAN-HCT) 320-25 MG tablet TAKE 1 TABLET BY MOUTH ONCE A DAY. 03/30/22   Jonelle Sidle, MD  vitamin B-12 (CYANOCOBALAMIN) 100 MCG tablet Take 100 mcg by mouth daily.    [provider]  vitamin C (ASCORBIC ACID) 500 MG tablet Take 500 mg by mouth daily.    [provider]  Zinc 100 MG TABS Take 1 tablet by mouth daily.    [provider]     Allergies:    Allergies  Allergen Reactions   Fish Oil Hives   Oxycontin [Oxycodone Hcl] Other (See Comments)    "made me crazy"    Social History:   Social History   Socioeconomic History   Marital status: Single    Spouse name: Not on file   Number of children: Not on file   Years of education: Not on file   Highest education level: Not on file  Occupational History   Not on file  Tobacco Use   Smoking status: Never   Smokeless tobacco: Never  Vaping Use   Vaping Use: Never used  Substance and Sexual Activity   Alcohol use: No    Alcohol/week: 0.0 standard drinks of alcohol   Drug use: No   Sexual activity: Not on file  Other Topics Concern   Not on file  Social History Narrative   ** Merged History Encounter **       Social Determinants of Health   Financial Resource Strain: Not on file  Food Insecurity: No Food Insecurity (08/11/2022)   Hunger Vital Sign    Worried About Running Out of Food in the Last Year: Never true    Ran Out of Food in the Last Year: Never true  Transportation Needs: No Transportation Needs (08/11/2022)   PRAPARE - Administrator, Civil Service (Medical): No    Lack of Transportation (Non-Medical): No  Physical Activity: Not on file  Stress: Not on file  Social Connections: Not on file  Intimate Partner Violence: Not At Risk (08/11/2022)   Humiliation, Afraid, Rape, and Kick questionnaire    Fear of Current or Ex-Partner: No    Emotionally Abused: No    Physically Abused: No    Sexually Abused: No    Family  History:   The patient's family history includes Coronary artery disease in an other family member; Diabetes in her mother.    ROS:  Please see the history of present illness.  All other ROS reviewed and negative.     Physical Exam/Data:   Vitals:   08/20/22 0700 08/20/22 0730 08/20/22 0800 08/20/22 0810  BP: (!) 153/62 (!) 128/57 (!) 128/55   Pulse: (!) 38 77 72   Resp: 14  16 16   Temp:    98.1 F (36.7 C)  TempSrc:    Oral  SpO2: 96% 97% 95%   Weight:       No intake or output data in the 24 hours ending 08/20/22 0843 Filed Weights   08/20/22 0250  Weight: 91.6 kg   Body mass index is 36.95 kg/m.   Gen: no distress, morbid obesity   Neck: No JVD Cardiac: No Rubs or Gallops, soft systolic crescendo murmur, RRR (rates 70 on exam) +2 radial pulses Respiratory: Clear to auscultation bilaterally, normal effort, normal  respiratory rate GI: Soft, nontender, non-distended  MS: +1 bilateral  edema;  moves all extremities Integument: Skin feels warm Neuro:  At time of evaluation, alert and oriented to person/place/time/situation  Psych: Normal affect, patient feels ok presently  EKG:  The ECG that was done  was personally reviewed and demonstrates SR 1st degree HB with LBBB Telemetry: 5 beat run of NSVT, Morning [place] HB rates 44 at lowest  Relevant CV Studies:    ECHO COMPLETE WITH IMAGING ENHANCING AGENT 08/12/2022  Narrative ECHOCARDIOGRAM REPORT    Patient Name:   LASHANE WHELPLEY Thull Date of Exam: 08/12/2022 Medical Rec #:  [id number]     Height:       62.0 in Accession #:    [id number]    Weight:       203.0 lb Date of Birth:  1929/09/28     BSA:          1.924 m Patient Age:    89 years      BP:           147/43 mmHg Patient Gender: F             HR:           66 bpm. [facility]  Procedure: 2D Echo, Cardiac Doppler, Color Doppler and Intracardiac Opacification Agent  Indications:    Chest pain  History:        Patient has prior history of  Echocardiogram examinations, most recent 12/24/2009. Signs/Symptoms:Chest Pain; Risk Factors:Hypertension and Diabetes.  Sonographer:    Mikki Harbor Referring Phys: [id number] Ellsworth Lennox  IMPRESSIONS   1. Septal motion consistent with conduction delay. Left ventricular ejection fraction, by estimation, is 55 to 60%. The left ventricle has normal function. There is moderate concentric left ventricular hypertrophy. Left ventricular diastolic parameters are indeterminate. 2. Right ventricular systolic function is normal. The right ventricular size is normal. There is normal pulmonary artery systolic pressure. 3. Trivial mitral valve regurgitation. 4. The aortic valve is tricuspid. Aortic valve regurgitation is not visualized. Aortic valve sclerosis/calcification is present, without any evidence of aortic stenosis. 5. The inferior vena cava is normal in size with greater than 50% respiratory variability, suggesting right atrial pressure of 3 mmHg.  FINDINGS Left Ventricle: Septal motion consistent with conduction delay. Left ventricular ejection fraction, by estimation, is 55 to 60%. The left ventricle has normal function. Definity contrast agent was given IV to delineate the left ventricular endocardial borders. The left ventricular internal cavity size was normal in size. There is moderate concentric left ventricular hypertrophy. Left ventricular diastolic parameters are indeterminate.  Right Ventricle: The right ventricular size is normal. Right vetricular wall thickness was not assessed. Right ventricular systolic function is normal. There is normal pulmonary artery systolic pressure. The tricuspid regurgitant velocity is 2.58 m/s, and with an assumed right atrial pressure of 3 mmHg, the estimated right ventricular systolic  pressure is 29.6 mmHg.  Left Atrium: Left atrial size was normal in size.  Right Atrium: Right atrial size was normal in size.  Pericardium: There is no  evidence of pericardial effusion.  Mitral Valve: There is mild thickening of the mitral valve leaflet(s). Mild mitral annular calcification. Trivial mitral valve regurgitation. MV peak gradient, 3.4 mmHg. The mean mitral valve gradient is 1.0 mmHg.  Tricuspid Valve: The tricuspid valve is normal in structure. Tricuspid valve regurgitation is trivial.  Aortic Valve: The aortic valve is tricuspid. Aortic valve regurgitation is not visualized. Aortic valve sclerosis/calcification is present, without any evidence of aortic stenosis. Aortic valve mean gradient measures 9.0 mmHg. Aortic valve peak gradient measures 14.9 mmHg. Aortic valve area, by VTI measures 2.32 cm.  Pulmonic Valve: The pulmonic valve was normal in structure. Pulmonic valve regurgitation is mild.  Aorta: The aortic root and ascending aorta are structurally normal, with no evidence of dilitation.  Venous: The inferior vena cava is normal in size with greater than 50% respiratory variability, suggesting right atrial pressure of 3 mmHg.  IAS/Shunts: No atrial level shunt detected by color flow Doppler.   LEFT VENTRICLE PLAX 2D LVIDd:         4.40 cm     Diastology LVIDs:         3.10 cm     LV e' medial:    4.57 cm/s LV PW:         1.60 cm     LV E/e' medial:  12.2 LV IVS:        1.50 cm     LV e' lateral:   5.55 cm/s LVOT diam:     2.00 cm     LV E/e' lateral: 10.0 LV SV:         114 LV SV Index:   59 LVOT Area:     3.14 cm  LV Volumes (MOD) LV vol d, MOD A4C: 45.1 ml LV vol s, MOD A4C: 17.9 ml LV SV MOD A4C:     45.1 ml  RIGHT VENTRICLE RV Basal diam:  3.60 cm RV Mid diam:    2.90 cm RV S prime:     9.14 cm/s TAPSE (M-mode): 2.2 cm  LEFT ATRIUM             Index        RIGHT ATRIUM           Index LA diam:        4.40 cm 2.29 cm/m   RA Area:     19.40 cm LA Vol (A2C):   58.1 ml 30.20 ml/m  RA Volume:   55.70 ml  28.95 ml/m LA Vol (A4C):   56.4 ml 29.31 ml/m LA Biplane Vol: 59.8 ml 31.08 ml/m AORTIC  VALVE                     PULMONIC VALVE AV Area (Vmax):    2.46 cm      PV Vmax:       1.30 m/s AV Area (Vmean):   2.33 cm      PV Peak grad:  6.8 mmHg AV Area (VTI):     2.32 cm AV Vmax:           193.00 cm/s AV Vmean:          140.000 cm/s AV VTI:            0.491 m AV Peak Grad:  14.9 mmHg AV Mean Grad:      9.0 mmHg LVOT Vmax:         151.00 cm/s LVOT Vmean:        104.000 cm/s LVOT VTI:          0.362 m LVOT/AV VTI ratio: 0.74  AORTA Ao Root diam: 3.40 cm Ao Asc diam:  3.30 cm  MITRAL VALVE                TRICUSPID VALVE MV Area (PHT): 3.40 cm     TR Peak grad:   26.6 mmHg MV Area VTI:   4.39 cm     TR Vmax:        258.00 cm/s MV Peak grad:  3.4 mmHg MV Mean grad:  1.0 mmHg     SHUNTS MV Vmax:       0.92 m/s     Systemic VTI:  0.36 m MV Vmean:      54.3 cm/s    Systemic Diam: 2.00 cm MV Decel Time: 223 msec MV E velocity: 55.70 cm/s MV A velocity: 104.00 cm/s MV E/A ratio:  0.54  Dietrich PatesPaula Ross MD Electronically signed by Dietrich PatesPaula Ross MD Signature Date/Time: 08/12/2022/4:01:09 PM    Final     Laboratory Data:  Chemistry Recent Labs  Lab 08/14/22 0539 08/20/22 0320  NA 141 139  K 3.8 4.1  CL 106 104  CO2 26 26  GLUCOSE 128* 152*  BUN 24* 28*  CREATININE 1.04* 1.32*  CALCIUM 9.3 9.5  GFRNONAA 51* 38*  ANIONGAP 9 9    Recent Labs  Lab 08/14/22 0539 08/20/22 0320  PROT 6.3* 6.8  ALBUMIN 3.6 3.9  AST 17 22  ALT 14 18  ALKPHOS 57 71  BILITOT 0.7 0.5   Hematology Recent Labs  Lab 08/14/22 0949 08/20/22 0320  WBC 5.7 7.7  RBC 3.77* 3.65*  HGB 11.3* 10.8*  HCT 34.4* 33.4*  MCV 91.2 91.5  MCH 30.0 29.6  MCHC 32.8 32.3  RDW 13.2 13.3  PLT 198 217   Cardiac EnzymesNo results for input(s): "TROPONINI" in the last 168 hours. No results for input(s): "TROPIPOC" in the last 168 hours.  BNPNo results for input(s): "BNP", "PROBNP" in the last 168 hours.  DDimer No results for input(s): "DDIMER" in the last 168 hours.  Radiology/Studies:   DG Chest Port 1 View  Result Date: 08/20/2022 CLINICAL DATA:  Chest pain EXAM: PORTABLE CHEST 1 VIEW COMPARISON:  08/11/2022 FINDINGS: Lungs are clear. No pneumothorax or pleural effusion. Cardiac size is mildly enlarged, unchanged. Pulmonary vascularity is normal. No acute bone abnormality. IMPRESSION: Stable cardiomegaly. Electronically Signed   By: Helyn NumbersAshesh  Parikh M.D.   On: 08/20/2022 03:42    Assessment and Plan:   CAD with anginal chest pain - no increase in biomarkers and stable EKG Mobitz Type I Heart block LBBB HTN HLD Morbid Obesity - given her coffee intake, cannot perform NM Stress test today (she is not able to do a treadmill and, even if she were is unable to get exercise test because of her LBBB) - cannot tolerate BB (conduction disease) - continue ASA 81 mg PO daily - LDL < 70, continue current statin  - mild LE edema, continue norvasc 10 mg - increase Imdur to 60 mg PO daily - due to staffing issues we are unable to offer NM Stress [facility] over [facility]; she has a 3.5 mm Stent, but given bed shortages and staffing,  I have concerns about attempting transfer for weekend CCTA (would need  dedicated convolution kernel, stent thickness is 81 um); presently we will plan for Monday Stress test; in the event staffing issues change I will addend my note - I have talked with family: if she is chest pain free, has no DOE, and has BP controlled on increased anti-anginal therapy, she could potentially leave with outpatient NM Stress plan   Systolic murmur - related to aortic sclerosis   For questions or updates, please contact [facility] Please consult [email or web address] for contact info under Cardiology/STEMI.   Rudean Haskell, MD [address] [place], [place] [address] [phone]  8:43 AM

## 2022-08-20 NOTE — Progress Notes (Signed)
  Transition of Care [facility]) Screening Note   Patient Details  Name: Maria Durham Date of Birth: 08-21-1929   Transition of Care [facility]) CM/SW Contact:    Iona Beard, Lena Phone Number: 08/20/2022, 10:56 AM    Transition of Care Department [facility]) has reviewed patient and no TOC needs have been identified at this time. We will continue to monitor patient advancement through interdisciplinary progression rounds. If new patient transition needs arise, please place a TOC consult.

## 2022-08-20 NOTE — ED Provider Notes (Signed)
[facility] EMERGENCY DEPARTMENT Provider Note   CSN: [id number] Arrival date & time: 08/20/22  0240     History  Chief Complaint  Patient presents with   Palpitations    Maria Durham is a 88 y.o. female.  Patient is a 88 year old female with past medical history of coronary artery disease with stent, second-degree heart block, hypothyroidism, diabetes, chronic renal insufficiency.  Patient presenting by EMS for evaluation of chest tightness.  Patient was awakened from sleep approximately 1 hour ago with a tight sensation to her upper chest and feeling as if she could not catch her breath.  This lasted for several minutes, then she called her daughter in law for assistance.  The daughter in law is a retired [employer] scientist who gave her sublingual nitroglycerin and aspirin.  Symptoms seem to resolve shortly afterward.  EMS was called and patient was transported here.  She is having frequent episodes of second-degree heart block for EMS and here in the ER.  She was admitted 1 week ago here at [facility] with similar complaints.  There was discussion of a stress test to be performed as an outpatient, however this has not happened as of yet.  The history is provided by the patient.       Home Medications Prior to Admission medications   Medication Sig Start Date End Date Taking? Authorizing Provider  acetaminophen (TYLENOL) 325 MG tablet Take 2 tablets (650 mg total) by mouth every 6 (six) hours as needed for mild pain (or Fever >/= 101). 08/14/22   Sheikh, Omair Latif, DO  amLODipine (NORVASC) 10 MG tablet Take 1 tablet (10 mg total) by mouth daily. 08/15/22   Raiford Noble Latif, DO  ascorbic acid (VITAMIN C) 500 MG tablet Take 500 mg by mouth daily.    [provider]  aspirin 81 MG tablet Take 81 mg by mouth daily.    [provider]  Cholecalciferol (VITAMIN D3) 125 MCG (5000 UT) CAPS Take by mouth.    [provider]  isosorbide mononitrate (IMDUR) 30 MG 24 hr tablet  Take 1 tablet (30 mg total) by mouth daily. 08/15/22   Raiford Noble Latif, DO  levothyroxine (SYNTHROID) 75 MCG tablet Take 75 mcg by mouth daily before breakfast.    [provider]  Multiple Vitamins-Minerals (MULTIVITAMIN PO) Take by mouth.    [provider]  Multiple Vitamins-Minerals (PRESERVISION AREDS 2+MULTI VIT PO) Take 1 tablet by mouth daily.    [provider]  nitroGLYCERIN (NITROSTAT) 0.4 MG SL tablet Place 1 tablet (0.4 mg total) under the tongue every 5 (five) minutes x 3 doses as needed (if no relief after 2nd dose, proceed to ED for evaluation). Reported on 01/07/2016 11/11/21   Satira Sark, MD  ondansetron (ZOFRAN) 4 MG tablet Take 1 tablet (4 mg total) by mouth every 6 (six) hours as needed for nausea. 08/14/22   Raiford Noble Latif, DO  pantoprazole (PROTONIX) 40 MG tablet Take 40 mg by mouth daily.    [provider]  polyethylene glycol (MIRALAX / GLYCOLAX) 17 g packet Take 17 g by mouth daily as needed for mild constipation. 08/14/22   Raiford Noble Latif, DO  rosuvastatin (CRESTOR) 10 MG tablet Take 1 tablet (10 mg total) by mouth daily. 11/11/21   Satira Sark, MD  valsartan-hydrochlorothiazide (DIOVAN-HCT) 320-25 MG tablet TAKE 1 TABLET BY MOUTH ONCE A DAY. 03/30/22   Satira Sark, MD  vitamin B-12 (CYANOCOBALAMIN) 100 MCG tablet Take 100 mcg  by mouth daily.    [provider]  vitamin C (ASCORBIC ACID) 500 MG tablet Take 500 mg by mouth daily.    [provider]  Zinc 100 MG TABS Take 1 tablet by mouth daily.    [provider]      Allergies    Fish oil and Oxycontin [oxycodone hcl]    Review of Systems   Review of Systems  All other systems reviewed and are negative.   Physical Exam Updated Vital Signs BP 131/61   Pulse 82   Temp 97.9 F (36.6 C) (Oral)   Resp 18   Wt 91.6 kg   SpO2 99%   BMI 36.95 kg/m  Physical Exam Vitals and nursing note reviewed.  Constitutional:       General: She is not in acute distress.    Appearance: She is well-developed. She is not diaphoretic.  HENT:     Head: Normocephalic and atraumatic.  Cardiovascular:     Rate and Rhythm: Normal rate and regular rhythm.     Heart sounds: No murmur heard.    No friction rub. No gallop.  Pulmonary:     Effort: Pulmonary effort is normal. No respiratory distress.     Breath sounds: Normal breath sounds. No wheezing.  Abdominal:     General: Bowel sounds are normal. There is no distension.     Palpations: Abdomen is soft.     Tenderness: There is no abdominal tenderness.  Musculoskeletal:        General: No swelling. Normal range of motion.     Cervical back: Normal range of motion and neck supple.     Right lower leg: No edema.     Left lower leg: No edema.  Skin:    General: Skin is warm and dry.  Neurological:     General: No focal deficit present.     Mental Status: She is alert and oriented to person, place, and time.     ED Results / Procedures / Treatments   Labs (all labs ordered are listed, but only abnormal results are displayed) Labs Reviewed  COMPREHENSIVE METABOLIC PANEL  CBC WITH DIFFERENTIAL/PLATELET  TROPONIN I (HIGH SENSITIVITY)    EKG EKG Interpretation  Date/Time:  Friday August 20 2022 02:52:31 EDT Ventricular Rate:  79 PR Interval:  301 QRS Duration: 155 QT Interval:  434 QTC Calculation: 498 R Axis:   -45 Text Interpretation: Sinus rhythm Prolonged PR interval Left bundle branch block No significant change since 08/11/2022 Confirmed by Geoffery Lyons ([id number]) on 08/20/2022 2:56:24 AM  Radiology No results found.  Procedures Procedures    Medications Ordered in ED Medications - No data to display  ED Course/ Medical Decision Making/ A&P  This patient presents to the ED for concern of palpitations, chest tightness, and shortness of breath, this involves an extensive number of treatment options, and is a complaint that carries with it a high  risk of complications and morbidity.  The differential diagnosis includes acute coronary syndrome, cardiac arrhythmia, pulmonary embolism   Co morbidities that complicate the patient evaluation  Prior cardiac stenting in 2014   Additional history obtained:  No additional history or external records needed   Lab Tests:  I Ordered, and personally interpreted labs.  The pertinent results include: Unremarkable CBC, metabolic panel, and troponin   Imaging Studies ordered:  I ordered imaging studies including chest x-ray I independently visualized and interpreted imaging which showed no acute process I agree with the radiologist interpretation  Cardiac Monitoring: / EKG:  The patient was maintained on a cardiac monitor.  I personally viewed and interpreted the cardiac monitored which showed an underlying rhythm of: Sinus rhythm alternating with second-degree Mobitz 2   Consultations Obtained:  I requested consultation with the hospitalist,  and discussed lab and imaging findings as well as pertinent plan - they recommend: Admission for chest pain rule out   Problem List / ED Course / Critical interventions / Medication management  Patient with history of prior cardiac stenting in 2014 and recent admission for chest discomfort.  Patient presenting with palpitations/tightness in her chest/shortness of breath that woke her from sleep.  Symptoms resolved after her daughter-in-law gave her nitroglycerin. She arrives here with stable vital signs and symptoms that have since resolved.  Work-up initiated including laboratory studies and EKG and chest x-ray, the results are essentially unremarkable. Patient is due to have a stress test as an outpatient at some point in the near future.  With the uncertainties behind her previous stent and the etiology of these 2 episodes that have occurred over the past week, I feel as though patient should be admitted for further observation and cardiology  consultation.  I have spoken with the hospitalist who agrees to admit. No medications ordered I have reviewed the patients home medicines and have made adjustments as needed   Social Determinants of Health:  None   Test / Admission - Considered:  Patient to be admitted to the hospitalist service for further evaluation.  Final Clinical Impression(s) / ED Diagnoses Final diagnoses:  None    Rx / DC Orders ED Discharge Orders     None         Geoffery Lyons, MD 08/20/22 [phone]

## 2022-08-20 NOTE — Assessment & Plan Note (Signed)
-   Continue Synthroid - Last TSH was within normal limits

## 2022-08-20 NOTE — Assessment & Plan Note (Signed)
Continue statin. 

## 2022-08-20 NOTE — ED Notes (Signed)
Report given. Receiving nurse coming down to get patient in 15 minutes

## 2022-08-21 DIAGNOSIS — I2510 Atherosclerotic heart disease of native coronary artery without angina pectoris: Secondary | ICD-10-CM | POA: Diagnosis not present

## 2022-08-21 DIAGNOSIS — E785 Hyperlipidemia, unspecified: Secondary | ICD-10-CM | POA: Diagnosis not present

## 2022-08-21 DIAGNOSIS — R079 Chest pain, unspecified: Secondary | ICD-10-CM | POA: Diagnosis not present

## 2022-08-21 DIAGNOSIS — I159 Secondary hypertension, unspecified: Secondary | ICD-10-CM | POA: Diagnosis not present

## 2022-08-21 LAB — CBC
HCT: 30.8 % — ABNORMAL LOW (ref 36.0–46.0)
Hemoglobin: 10.1 g/dL — ABNORMAL LOW (ref 12.0–15.0)
MCH: 30.3 pg (ref 26.0–34.0)
MCHC: 32.8 g/dL (ref 30.0–36.0)
MCV: 92.5 fL (ref 80.0–100.0)
Platelets: 194 10*3/uL (ref 150–400)
RBC: 3.33 MIL/uL — ABNORMAL LOW (ref 3.87–5.11)
RDW: 13.2 % (ref 11.5–15.5)
WBC: 5.7 10*3/uL (ref 4.0–10.5)
nRBC: 0 % (ref 0.0–0.2)

## 2022-08-21 LAB — BASIC METABOLIC PANEL
Anion gap: 4 — ABNORMAL LOW (ref 5–15)
BUN: 25 mg/dL — ABNORMAL HIGH (ref 8–23)
CO2: 27 mmol/L (ref 22–32)
Calcium: 9 mg/dL (ref 8.9–10.3)
Chloride: 106 mmol/L (ref 98–111)
Creatinine, Ser: 1.22 mg/dL — ABNORMAL HIGH (ref 0.44–1.00)
GFR, Estimated: 42 mL/min — ABNORMAL LOW (ref >60)
Glucose, Bld: 127 mg/dL — ABNORMAL HIGH (ref 70–99)
Potassium: 4.1 mmol/L (ref 3.5–5.1)
Sodium: 137 mmol/L (ref 135–145)

## 2022-08-21 MED ORDER — POLYETHYLENE GLYCOL 3350 17 G PO PACK
17.0000 g | PACK | Freq: Every day | ORAL | Status: DC | PRN
  Administered 2022-08-21: 17 g via ORAL
  Filled 2022-08-21: qty 1

## 2022-08-21 MED ORDER — ALUM & MAG HYDROXIDE-SIMETH 200-200-20 MG/5ML PO SUSP: 30.0000 mL | Freq: Four times a day (QID) | ORAL | Status: DC | PRN

## 2022-08-21 NOTE — Progress Notes (Signed)
PROGRESS NOTE    MAYETTA CASTLEMAN  ZJI:[id number] DOB: 1929-05-25 DOA: 08/20/2022 PCP: Monico Blitz, MD   Brief Narrative:   CONSTANZA MINCY is a 88 y.o. female with medical history significant of coronary artery disease, essential hypertension, hypothyroidism, GERD, type 2 diabetes mellitus-diet controlled, and more presents to the ED with a chief complaint of chest pain.  Assessment & Plan:   Chest pain:  -Chest pain improved with nitroglycerin.  No ST changes on the EKG.  Chest x-ray did not show any acute findings.  Troponins have been negative. -Last echo done on 08/12/2022 showed EF of 55 to 60%, moderate concentric left ventricular hypertrophy. -Dose of Imdur increased.Continue aspirin -Cardiology consulted.  Monitor on telemetry.  Plan for stress test on Monday. -Discussed with cardiology on-call Dr. Kem Parkinson will keep patient in the hospital until Monday for a stress test.  We will keep her n.p.o. after midnight on Sunday   History of coronary artery disease:  -Status post PCI with stent placement in 2014.   Continue aspirin, Imdur, Crestor.  ARB on hold for elevated creatinine   AKI on CKD stage IIIb: Thiazide, ARB on hold.  Continue to monitor.  Last creatinine was 1.2 on 9/20.   Left calf pain: venous Doppler negative for  DVT   Hypothyroidism: Continue Synthyroid   GERD: On Pepcid   Hypertension: Monitor blood pressure.  Continue amlodipine.  Home ARB, hydrochlorothiazide on hold  Hyperlipidemia: Continue statins  Mobitz type I heart block LBBB: Monitor closely on telemetry  Morbid obesity with BMI of 35: Out patient follow-up  DVT prophylaxis: Heparin Code Status: Full code Family Communication:  None present at bedside.  Plan of care discussed with patient in length and she verbalized understanding and agreed with it. Disposition Plan: Home  Consultants:  Cardiology  Procedures:  None  Antimicrobials:  None  Status is:  Observation   Subjective: Patient seen and examined.  Denies any chest pain or shortness of breath.  I talked to patient's daughter-in-law on the phone and we discussed plan of care.  Family is not comfortable taking her back home until she gets the stress test.  Objective: Vitals:   08/20/22 2149 08/20/22 2200 08/21/22 0636 08/21/22 0958  BP: (!) 154/56 (!) 154/55 (!) 160/58 (!) 150/53  Pulse: 76 78 70 72  Resp: 16 20 20  (!) 22  Temp: 97.7 F (36.5 C) 97.9 F (36.6 C) 97.8 F (36.6 C)   TempSrc: Oral Oral Oral   SpO2: 97% 94% 96% 97%  Weight:      Height:        Intake/Output Summary (Last 24 hours) at 08/21/2022 1227 Last data filed at 08/21/2022 0900 Gross per 24 hour  Intake 960 ml  Output --  Net 960 ml   Filed Weights   08/20/22 0250 08/20/22 1701  Weight: 91.6 kg 91.2 kg    Examination:  General exam: Appears calm and comfortable, on room air, communicating well Respiratory system: Clear to auscultation. Respiratory effort normal. Cardiovascular system: S1 & S2 heard, soft systolic murmur noted, RRR. No JVD, rubs, gallops or clicks. No pedal edema. Gastrointestinal system: Abdomen is nondistended, soft and nontender. No organomegaly or masses felt. Normal bowel sounds heard. Central nervous system: Alert and oriented. No focal neurological deficits. Extremities: Symmetric 5 x 5 power. Skin: No rashes, lesions or ulcers Psychiatry: Judgement and insight appear normal. Mood & affect appropriate.    Data Reviewed: I have personally reviewed following labs and imaging studies  CBC: Recent Labs  Lab 08/20/22 0320 08/21/22 0440  WBC 7.7 5.7  NEUTROABS 5.5  --   HGB 10.8* 10.1*  HCT 33.4* 30.8*  MCV 91.5 92.5  PLT 217 194   Basic Metabolic Panel: Recent Labs  Lab 08/20/22 0320 08/20/22 0520 08/21/22 0440  NA 139  --  137  K 4.1  --  4.1  CL 104  --  106  CO2 26  --  27  GLUCOSE 152*  --  127*  BUN 28*  --  25*  CREATININE 1.32*  --  1.22*   CALCIUM 9.5  --  9.0  MG  --  2.2  --    GFR: Estimated Creatinine Clearance: 32.2 mL/min (A) (by C-G formula based on SCr of 1.22 mg/dL (H)). Liver Function Tests: Recent Labs  Lab 08/20/22 0320  AST 22  ALT 18  ALKPHOS 71  BILITOT 0.5  PROT 6.8  ALBUMIN 3.9   No results for input(s): "LIPASE", "AMYLASE" in the last 168 hours. No results for input(s): "AMMONIA" in the last 168 hours. Coagulation Profile: No results for input(s): "INR", "PROTIME" in the last 168 hours. Cardiac Enzymes: No results for input(s): "CKTOTAL", "CKMB", "CKMBINDEX", "TROPONINI" in the last 168 hours. BNP (last 3 results) No results for input(s): "PROBNP" in the last 8760 hours. HbA1C: No results for input(s): "HGBA1C" in the last 72 hours. CBG: No results for input(s): "GLUCAP" in the last 168 hours. Lipid Profile: No results for input(s): "CHOL", "HDL", "LDLCALC", "TRIG", "CHOLHDL", "LDLDIRECT" in the last 72 hours. Thyroid Function Tests: No results for input(s): "TSH", "T4TOTAL", "FREET4", "T3FREE", "THYROIDAB" in the last 72 hours. Anemia Panel: No results for input(s): "VITAMINB12", "FOLATE", "FERRITIN", "TIBC", "IRON", "RETICCTPCT" in the last 72 hours. Sepsis Labs: No results for input(s): "PROCALCITON", "LATICACIDVEN" in the last 168 hours.  No results found for this or any previous visit (from the past 240 hour(s)).    Radiology Studies: US Venous Img Lower Unilateral Left (DVT)  Result Date: 08/20/2022 CLINICAL DATA:  Left lower extremity pain and edema EXAM: LEFT LOWER EXTREMITY VENOUS DOPPLER ULTRASOUND TECHNIQUE: Gray-scale sonography with compression, as well as color and duplex ultrasound, were performed to evaluate the deep venous system(s) from the level of the common femoral vein through the popliteal and proximal calf veins. COMPARISON:  None Available. FINDINGS: VENOUS Normal compressibility of the common femoral, superficial femoral, and popliteal veins, as well as the  visualized calf veins. Visualized portions of profunda femoral vein and great saphenous vein unremarkable. No filling defects to suggest DVT on grayscale or color Doppler imaging. Doppler waveforms show normal direction of venous flow, normal respiratory plasticity and response to augmentation. Limited views of the contralateral common femoral vein are unremarkable. OTHER None. Limitations: none IMPRESSION: Negative. Electronically Signed   By: Malachy Moan M.D.   On: 08/20/2022 08:46   DG Chest Port 1 View  Result Date: 08/20/2022 CLINICAL DATA:  Chest pain EXAM: PORTABLE CHEST 1 VIEW COMPARISON:  08/11/2022 FINDINGS: Lungs are clear. No pneumothorax or pleural effusion. Cardiac size is mildly enlarged, unchanged. Pulmonary vascularity is normal. No acute bone abnormality. IMPRESSION: Stable cardiomegaly. Electronically Signed   By: Helyn Numbers M.D.   On: 08/20/2022 03:42    Scheduled Meds:  amLODipine  10 mg Oral Daily   aspirin  81 mg Oral Daily   famotidine  20 mg Oral Daily   heparin  5,000 Units Subcutaneous Q8H   isosorbide mononitrate  60 mg Oral Daily   levothyroxine  75 mcg Oral QAC breakfast   rosuvastatin  10 mg Oral Daily   Continuous Infusions:   LOS: 0 days   Time spent: 35 minutes   Johny Pitstick Estill Cotta, MD [facility]  If 7PM-7AM, please contact night-coverage [email or web address] 08/21/2022, 12:27 PM

## 2022-08-21 NOTE — Plan of Care (Signed)
  Problem: Education: Goal: Knowledge of General Education information will improve Description Including pain rating scale, medication(s)/side effects and non-pharmacologic comfort measures Outcome: Progressing   Problem: Health Behavior/Discharge Planning: Goal: Ability to manage health-related needs will improve Outcome: Progressing   

## 2022-08-22 DIAGNOSIS — K219 Gastro-esophageal reflux disease without esophagitis: Secondary | ICD-10-CM | POA: Diagnosis not present

## 2022-08-22 DIAGNOSIS — I2510 Atherosclerotic heart disease of native coronary artery without angina pectoris: Secondary | ICD-10-CM | POA: Diagnosis not present

## 2022-08-22 DIAGNOSIS — I10 Essential (primary) hypertension: Secondary | ICD-10-CM

## 2022-08-22 DIAGNOSIS — R7989 Other specified abnormal findings of blood chemistry: Secondary | ICD-10-CM | POA: Diagnosis not present

## 2022-08-22 DIAGNOSIS — R079 Chest pain, unspecified: Secondary | ICD-10-CM | POA: Diagnosis not present

## 2022-08-22 LAB — BASIC METABOLIC PANEL
Anion gap: 7 (ref 5–15)
BUN: 23 mg/dL (ref 8–23)
CO2: 26 mmol/L (ref 22–32)
Calcium: 8.9 mg/dL (ref 8.9–10.3)
Chloride: 105 mmol/L (ref 98–111)
Creatinine, Ser: 1.17 mg/dL — ABNORMAL HIGH (ref 0.44–1.00)
GFR, Estimated: 44 mL/min — ABNORMAL LOW (ref >60)
Glucose, Bld: 125 mg/dL — ABNORMAL HIGH (ref 70–99)
Potassium: 4 mmol/L (ref 3.5–5.1)
Sodium: 138 mmol/L (ref 135–145)

## 2022-08-22 LAB — MAGNESIUM: Magnesium: 2.2 mg/dL (ref 1.7–2.4)

## 2022-08-22 MED ORDER — IRBESARTAN 150 MG PO TABS
300.0000 mg | ORAL_TABLET | Freq: Every day | ORAL | Status: DC
  Administered 2022-08-22 – 2022-08-23 (×2): 300 mg via ORAL
  Filled 2022-08-22 (×2): qty 2

## 2022-08-22 MED ORDER — HYDRALAZINE HCL 20 MG/ML IJ SOLN: 10.0000 mg | Freq: Four times a day (QID) | INTRAMUSCULAR | Status: DC | PRN

## 2022-08-22 MED ORDER — VALSARTAN-HYDROCHLOROTHIAZIDE 320-25 MG PO TABS: 1.0000 | ORAL_TABLET | Freq: Every day | ORAL | Status: DC

## 2022-08-22 MED ORDER — HYDROCHLOROTHIAZIDE 25 MG PO TABS
25.0000 mg | ORAL_TABLET | Freq: Every day | ORAL | Status: DC
  Administered 2022-08-22 – 2022-08-23 (×2): 25 mg via ORAL
  Filled 2022-08-22 (×2): qty 1

## 2022-08-22 NOTE — Progress Notes (Signed)
PROGRESS NOTE    Maria Durham DOB: [birth year] DOA: 08/20/2022 PCP: Monico Blitz, MD   Brief Narrative:   BERA PINELA is a 88 y.o. female with medical history significant of coronary artery disease, essential hypertension, hypothyroidism, GERD, type 2 diabetes mellitus-diet controlled, and more presents to the ED with a chief complaint of chest pain.  Assessment & Plan:   Chest pain:  -Chest pain improved with nitroglycerin.  No ST changes on the EKG.  Chest x-ray did not show any acute findings.  Troponins have been negative. -Last echo done on 08/12/2022 showed EF of 55 to 60%, moderate concentric left ventricular hypertrophy. -Continue aspirin, statin and Imdur -Cardiology consulted.  Monitor on telemetry.  Plan for stress test on Monday 08/23/2022.  We will keep her n.p.o. after midnight    History of coronary artery disease:  -Status post PCI with stent placement in 2014.   Continue aspirin, Imdur, Crestor.     AKI on CKD stage IIIb:  -Labs pending this morning.    left calf pain: venous Doppler negative for  DVT   Hypothyroidism: Last TSH within normal limits continue Synthyroid   GERD: On Pepcid   Hypertension: Blood pressure is elevated.  Continue amlodipine.,  Resume ARB, hydrochlorothiazide  -Monitor blood pressure closely  Hyperlipidemia: Continue statins  Mobitz type I heart block LBBB: Monitor closely on telemetry  Morbid obesity with BMI of 35: Out patient follow-up  DVT prophylaxis: Heparin Code Status: Full code Family Communication:  None present at bedside.  Plan of care discussed with patient in length and she verbalized understanding and agreed with it.  Discussed plan of care with patient's daughter-in-law on 9/30 on the phone and she verbalized understanding  Disposition Plan: Home  Consultants:  Cardiology  Procedures:  None  Antimicrobials:  None  Status is: Observation   Subjective: Patient seen and examined.   Resting comfortably on the bed.  Denies any complaints.  No acute events overnight.  Objective: Vitals:   08/21/22 1406 08/21/22 1644 08/21/22 2039 08/22/22 0434  BP: (!) 152/65 (!) 156/56 (!) 152/65 (!) 166/65  Pulse: 95 72 74 (!) 57  Resp: 17 20 20 15   Temp: 97.8 F (36.6 C) 98 F (36.7 C) 98.2 F (36.8 C)   TempSrc: Oral Oral Oral   SpO2: 97% 97% 96% 97%  Weight:      Height:       No intake or output data in the 24 hours ending 08/22/22 0946  Filed Weights   08/20/22 0250 08/20/22 1701  Weight: 91.6 kg 91.2 kg    Examination:  General exam: Appears calm and comfortable, on room air, communicating well Respiratory system: Clear to auscultation. Respiratory effort normal. Cardiovascular system: S1 & S2 heard, soft systolic murmur noted, RRR. No JVD, rubs, gallops or clicks. No pedal edema. Gastrointestinal system: Abdomen is nondistended, soft and nontender. No organomegaly or masses felt. Normal bowel sounds heard. Central nervous system: Alert and oriented. No focal neurological deficits. Extremities: Symmetric 5 x 5 power. Skin: No rashes, lesions or ulcers Psychiatry: Judgement and insight appear normal. Mood & affect appropriate.    Data Reviewed: I have personally reviewed following labs and imaging studies  CBC: Recent Labs  Lab 08/20/22 0320 08/21/22 0440  WBC 7.7 5.7  NEUTROABS 5.5  --   HGB 10.8* 10.1*  HCT 33.4* 30.8*  MCV 91.5 92.5  PLT 217 295    Basic Metabolic Panel: Recent Labs  Lab 08/20/22 0320 08/20/22  0520 08/21/22 0440  NA 139  --  137  K 4.1  --  4.1  CL 104  --  106  CO2 26  --  27  GLUCOSE 152*  --  127*  BUN 28*  --  25*  CREATININE 1.32*  --  1.22*  CALCIUM 9.5  --  9.0  MG  --  2.2  --     GFR: Estimated Creatinine Clearance: 32.2 mL/min (A) (by C-G formula based on SCr of 1.22 mg/dL (H)). Liver Function Tests: Recent Labs  Lab 08/20/22 0320  AST 22  ALT 18  ALKPHOS 71  BILITOT 0.5  PROT 6.8  ALBUMIN 3.9     No results for input(s): "LIPASE", "AMYLASE" in the last 168 hours. No results for input(s): "AMMONIA" in the last 168 hours. Coagulation Profile: No results for input(s): "INR", "PROTIME" in the last 168 hours. Cardiac Enzymes: No results for input(s): "CKTOTAL", "CKMB", "CKMBINDEX", "TROPONINI" in the last 168 hours. BNP (last 3 results) No results for input(s): "PROBNP" in the last 8760 hours. HbA1C: No results for input(s): "HGBA1C" in the last 72 hours. CBG: No results for input(s): "GLUCAP" in the last 168 hours. Lipid Profile: No results for input(s): "CHOL", "HDL", "LDLCALC", "TRIG", "CHOLHDL", "LDLDIRECT" in the last 72 hours. Thyroid Function Tests: No results for input(s): "TSH", "T4TOTAL", "FREET4", "T3FREE", "THYROIDAB" in the last 72 hours. Anemia Panel: No results for input(s): "VITAMINB12", "FOLATE", "FERRITIN", "TIBC", "IRON", "RETICCTPCT" in the last 72 hours. Sepsis Labs: No results for input(s): "PROCALCITON", "LATICACIDVEN" in the last 168 hours.  No results found for this or any previous visit (from the past 240 hour(s)).    Radiology Studies: No results found.  Scheduled Meds:  amLODipine  10 mg Oral Daily   aspirin  81 mg Oral Daily   famotidine  20 mg Oral Daily   heparin  5,000 Units Subcutaneous Q8H   isosorbide mononitrate  60 mg Oral Daily   levothyroxine  75 mcg Oral QAC breakfast   rosuvastatin  10 mg Oral Daily   Continuous Infusions:   LOS: 0 days   Time spent: 35 minutes   Maria Kiss Estill Cotta, MD [facility]  If 7PM-7AM, please contact night-coverage [email or web address] 08/22/2022, 9:46 AM

## 2022-08-23 ENCOUNTER — Observation Stay (HOSPITAL_BASED_OUTPATIENT_CLINIC_OR_DEPARTMENT_OTHER): Payer: Medicare Other

## 2022-08-23 DIAGNOSIS — R0789 Other chest pain: Secondary | ICD-10-CM

## 2022-08-23 DIAGNOSIS — I447 Left bundle-branch block, unspecified: Secondary | ICD-10-CM | POA: Diagnosis not present

## 2022-08-23 DIAGNOSIS — R079 Chest pain, unspecified: Secondary | ICD-10-CM | POA: Diagnosis not present

## 2022-08-23 DIAGNOSIS — I10 Essential (primary) hypertension: Secondary | ICD-10-CM | POA: Diagnosis not present

## 2022-08-23 LAB — NM MYOCAR MULTI W/SPECT W/WALL MOTION / EF
LV dias vol: 50 mL (ref 46–106)
LV sys vol: 11 mL
Nuc Stress EF: 78 %
Peak HR: 94 {beats}/min
RATE: 0.5
Rest HR: 75 {beats}/min
Rest Nuclear Isotope Dose: 9 mCi
SDS: 8
SRS: 4
SSS: 11
ST Depression (mm): 0 mm
Stress Nuclear Isotope Dose: 28 mCi
TID: 0.99

## 2022-08-23 LAB — BASIC METABOLIC PANEL
Anion gap: 7 (ref 5–15)
BUN: 27 mg/dL — ABNORMAL HIGH (ref 8–23)
CO2: 24 mmol/L (ref 22–32)
Calcium: 9.3 mg/dL (ref 8.9–10.3)
Chloride: 108 mmol/L (ref 98–111)
Creatinine, Ser: 1.11 mg/dL — ABNORMAL HIGH (ref 0.44–1.00)
GFR, Estimated: 47 mL/min — ABNORMAL LOW (ref >60)
Glucose, Bld: 123 mg/dL — ABNORMAL HIGH (ref 70–99)
Potassium: 4.2 mmol/L (ref 3.5–5.1)
Sodium: 139 mmol/L (ref 135–145)

## 2022-08-23 LAB — MAGNESIUM: Magnesium: 2.2 mg/dL (ref 1.7–2.4)

## 2022-08-23 MED ORDER — REGADENOSON 0.4 MG/5ML IV SOLN
INTRAVENOUS | Status: AC
  Administered 2022-08-23: 0.4 mg via INTRAVENOUS
  Filled 2022-08-23: qty 5

## 2022-08-23 MED ORDER — TECHNETIUM TC 99M TETROFOSMIN IV KIT
30.0000 | PACK | Freq: Once | INTRAVENOUS | Status: AC | PRN
  Administered 2022-08-23: 28 via INTRAVENOUS

## 2022-08-23 MED ORDER — SPIRONOLACTONE 25 MG PO TABS: 25.0000 mg | ORAL_TABLET | Freq: Every day | ORAL | 0 refills | Status: DC

## 2022-08-23 MED ORDER — SODIUM CHLORIDE FLUSH 0.9 % IV SOLN
INTRAVENOUS | Status: AC
  Administered 2022-08-23: 10 mL via INTRAVENOUS
  Filled 2022-08-23: qty 10

## 2022-08-23 MED ORDER — TECHNETIUM TC 99M TETROFOSMIN IV KIT
10.0000 | PACK | Freq: Once | INTRAVENOUS | Status: AC | PRN
  Administered 2022-08-23: 9 via INTRAVENOUS

## 2022-08-23 MED ORDER — SPIRONOLACTONE 25 MG PO TABS
25.0000 mg | ORAL_TABLET | Freq: Every day | ORAL | Status: DC
  Administered 2022-08-23: 25 mg via ORAL
  Filled 2022-08-23: qty 1

## 2022-08-23 NOTE — Progress Notes (Addendum)
Rounding Note    Patient Name: Maria Durham Date of Encounter: 08/23/2022  [facility] Cardiologist: Nona Dell, MD   Subjective  She did well over the weekend. She did not have CP.   Inpatient Medications    Scheduled Meds:  amLODipine  10 mg Oral Daily   aspirin  81 mg Oral Daily   famotidine  20 mg Oral Daily   heparin  5,000 Units Subcutaneous Q8H   hydrochlorothiazide  25 mg Oral Daily   irbesartan  300 mg Oral Daily   isosorbide mononitrate  60 mg Oral Daily   levothyroxine  75 mcg Oral QAC breakfast   rosuvastatin  10 mg Oral Daily   Continuous Infusions:  PRN Meds: acetaminophen **OR** acetaminophen, alum & mag hydroxide-simeth, hydrALAZINE, morphine injection, ondansetron **OR** ondansetron (ZOFRAN) IV, oxyCODONE, polyethylene glycol   Vital Signs    Vitals:   08/22/22 1440 08/22/22 1800 08/22/22 1954 08/23/22 0406  BP: (!) 156/68 119/66 (!) 138/58 (!) 158/71  Pulse: (!) 58 (!) 102 76 75  Resp: 16 16 18 16   Temp: 98.2 F (36.8 C)  97.8 F (36.6 C) 97.9 F (36.6 C)  TempSrc: Oral  Oral   SpO2: 97%  94% 100%  Weight:      Height:        Intake/Output Summary (Last 24 hours) at 08/23/2022 0939 Last data filed at 08/22/2022 1808 Gross per 24 hour  Intake 480 ml  Output 3 ml  Net 477 ml      08/20/2022    5:01 PM 08/20/2022    2:50 AM 08/11/2022   10:39 PM  Last 3 Weights  Weight (lbs) 201 lb 1 oz 202 lb 203 lb 0.7 oz  Weight (kg) 91.2 kg 91.627 kg 92.1 kg      Telemetry    Sinus brady, wenckebach  - Personally Reviewed  ECG    No new - Personally Reviewed  Physical Exam   Vitals:   08/22/22 1954 08/23/22 0406  BP: (!) 138/58 (!) 158/71  Pulse: 76 75  Resp: 18 16  Temp: 97.8 F (36.6 C) 97.9 F (36.6 C)  SpO2: 94% 100%    GEN: No acute distress.   Neck: No JVD Cardiac: RRR, no murmurs, rubs, or gallops.  Respiratory: Clear to auscultation bilaterally. GI: Soft, nontender, non-distended  MS: No edema; No  deformity. Neuro:  Nonfocal  Psych: Normal affect   Labs    High Sensitivity Troponin:   Recent Labs  Lab 08/11/22 1441 08/11/22 1613 08/20/22 0320 08/20/22 0520  TROPONINIHS 5 5 6 7      Chemistry Recent Labs  Lab 08/20/22 0320 08/20/22 0520 08/21/22 0440 08/22/22 0500 08/23/22 0706  NA 139  --  137 138 139  K 4.1  --  4.1 4.0 4.2  CL 104  --  106 105 108  CO2 26  --  27 26 24   GLUCOSE 152*  --  127* 125* 123*  BUN 28*  --  25* 23 27*  CREATININE 1.32*  --  1.22* 1.17* 1.11*  CALCIUM 9.5  --  9.0 8.9 9.3  MG  --  2.2  --  2.2 2.2  PROT 6.8  --   --   --   --   ALBUMIN 3.9  --   --   --   --   AST 22  --   --   --   --   ALT 18  --   --   --   --  ALKPHOS 71  --   --   --   --   BILITOT 0.5  --   --   --   --   GFRNONAA 38*  --  42* 44* 47*  ANIONGAP 9  --  4* 7 7    Lipids No results for input(s): "CHOL", "TRIG", "HDL", "LABVLDL", "LDLCALC", "CHOLHDL" in the last 168 hours.  Hematology Recent Labs  Lab 08/20/22 0320 08/21/22 0440  WBC 7.7 5.7  RBC 3.65* 3.33*  HGB 10.8* 10.1*  HCT 33.4* 30.8*  MCV 91.5 92.5  MCH 29.6 30.3  MCHC 32.3 32.8  RDW 13.3 13.2  PLT 217 194   Thyroid No results for input(s): "TSH", "FREET4" in the last 168 hours.  BNPNo results for input(s): "BNP", "PROBNP" in the last 168 hours.  DDimer No results for input(s): "DDIMER" in the last 168 hours.   Radiology    No results found.  Cardiac Studies   TTE 08/12/2022 Echo- EF 55-60, moderate LVH, no significant valve dx.  Patient Profile  DANYAL ADORNO is a 88 y.o. female with a history of CAD (prior pRCA disease s/p PCI [facility], prior seen at 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  Troponin was negative. No significant ST changes, has known LBBB.  Has Mobtiz type I which is stable. plan for NM stress today.   - cannot tolerate BB  (conduction disease) - continue ASA 81 mg PO daily - LDL < 70, continue current statin  - continue Imdur to 60 mg PO daily which was increased  HTN: not great control, BB not an option.  - adding spironolactone 25 mg daily -  continue norvasc 10 mg - HCTz 25 mg daily - imdur 60 d - irbesartan 300 mg daily  If NM normal or just mildly abnormal, will plan to continue on her current medications. Considering her age and stability, I opt to medically manage.  For questions or updates, please contact [place] Please consult [email or web address] for contact info under        Signed, Janina Mayo, MD  08/23/2022, 9:39 AM

## 2022-08-23 NOTE — Discharge Summary (Signed)
Physician Discharge Summary  Maria Durham AOZ:[id number] DOB: 1928-12-29 DOA: 08/20/2022  PCP: Kirstie Peri, MD  Admit date: 08/20/2022 Discharge date: 08/23/2022 Recommendations for Outpatient Follow-up:  Follow up with PCP in 1 weeks-call for appointment Please obtain BMP/CBC in one week  Discharge Dispo: home Discharge Condition: Stable Code Status:   Code Status: Full Code Diet recommendation:  Diet Order             Diet Heart Room service appropriate? Yes; Fluid consistency: Thin  Diet effective now                   Brief/Interim Summary: 88 year old female with CAD with PCI in 2014 hypertension hypothyroidism, GERD, type 2 diabetes mellitus diet controlled admitted with chest pain recurrent, improved with nitroglycerin, EKG no ST or T wave changes chest x-ray no acute finding.  Cardiology consulted with plan for a stress test 08/23/2022. Underwent nuclear scan stress test which was low risk and cardiology advised to discharge home.   Discharge Diagnoses:  Principal Problem:   Chest pain Active Problems:   HTN (hypertension)   GERD (gastroesophageal reflux disease)   Hypothyroidism   HLD (hyperlipidemia)   Pain of left calf   CAD (coronary artery disease)   Elevated serum creatinine  Assessment and Plan: Chest pain/CAD history with PCI in 2014: Serial troponin negative, chest pain improved with nitroglycerin, cardiology following plan for a stress test 10/2.  Remains on aspirin Imdur Crestor.  Continue home meds, stress test is unremarkable.  Mobitz type I heart block/lBBB: Monitor as op cardio f/u.  Hypertension: BP not great bleed controlled, beta-blocker not an option, added Aldactone 25 mg.  Continue Imdur HCTZ Norvasc ARBs AKI on CKD 3B: Creatinine stable 1.1 Left calf pain negative for DVT on Doppler Hypothyroidism continue Synthroid GERD on Pepcid Normocytic anemia hemoglobin stable in 10 g range. Class I obesity w/ BMI  35.6  Consults: Cardiology Subjective: Alert awake resting comfortably no chest pain.  Discharge Exam: Vitals:   08/22/22 1954 08/23/22 0406  BP: (!) 138/58 (!) 158/71  Pulse: 76 75  Resp: 18 16  Temp: 97.8 F (36.6 C) 97.9 F (36.6 C)  SpO2: 94% 100%   General: Pt is alert, awake, not in acute distress Cardiovascular: RRR, S1/S2 +, no rubs, no gallops Respiratory: CTA bilaterally, no wheezing, no rhonchi Abdominal: Soft, NT, ND, bowel sounds + Extremities: no edema, no cyanosis  Discharge Instructions  Discharge Instructions     Discharge instructions   Complete by: As directed    Please call call MD or return to ER for similar or worsening recurring problem that brought you to hospital or if any fever,nausea/vomiting,abdominal pain, uncontrolled pain, chest pain,  shortness of breath or any other alarming symptoms.  Please follow-up your doctor as instructed in a week time and call the office for appointment.  Please avoid alcohol, smoking, or any other illicit substance and maintain healthy habits including taking your regular medications as prescribed.  You were cared for by a hospitalist during your hospital stay. If you have any questions about your discharge medications or the care you received while you were in the hospital after you are discharged, you can call the unit and ask to speak with the hospitalist on call if the hospitalist that took care of you is not available.  Once you are discharged, your primary care physician will handle any further medical issues. Please note that NO REFILLS for any discharge medications will be authorized once you  are discharged, as it is imperative that you return to your primary care physician (or establish a relationship with a primary care physician if you do not have one) for your aftercare needs so that they can reassess your need for medications and monitor your lab values   Increase activity slowly   Complete by: As  directed       Allergies as of 08/23/2022       Reactions   Fish Oil Hives   Oxycontin [oxycodone Hcl] Other (See Comments)   "made me crazy"        Medication List     TAKE these medications    acetaminophen 325 MG tablet Commonly known as: TYLENOL Take 2 tablets (650 mg total) by mouth every 6 (six) hours as needed for mild pain (or Fever >/= 101).   amLODipine 10 MG tablet Commonly known as: NORVASC Take 1 tablet (10 mg total) by mouth daily.   ascorbic acid 500 MG tablet Commonly known as: VITAMIN C Take 500 mg by mouth daily.   ascorbic acid 500 MG tablet Commonly known as: VITAMIN C Take 500 mg by mouth daily.   aspirin 81 MG tablet Take 81 mg by mouth daily.   isosorbide mononitrate 30 MG 24 hr tablet Commonly known as: IMDUR Take 1 tablet (30 mg total) by mouth daily.   levothyroxine 75 MCG tablet Commonly known as: SYNTHROID Take 75 mcg by mouth daily before breakfast.   MULTIVITAMIN PO Take by mouth.   nitroGLYCERIN 0.4 MG SL tablet Commonly known as: NITROSTAT Place 1 tablet (0.4 mg total) under the tongue every 5 (five) minutes x 3 doses as needed (if no relief after 2nd dose, proceed to ED for evaluation). Reported on 01/07/2016   ondansetron 4 MG tablet Commonly known as: ZOFRAN Take 1 tablet (4 mg total) by mouth every 6 (six) hours as needed for nausea.   pantoprazole 40 MG tablet Commonly known as: PROTONIX Take 40 mg by mouth daily.   polyethylene glycol 17 g packet Commonly known as: MIRALAX / GLYCOLAX Take 17 g by mouth daily as needed for mild constipation.   PRESERVISION AREDS 2+MULTI VIT PO Take 1 tablet by mouth daily.   rosuvastatin 10 MG tablet Commonly known as: CRESTOR Take 1 tablet (10 mg total) by mouth daily.   spironolactone 25 MG tablet Commonly known as: ALDACTONE Take 1 tablet (25 mg total) by mouth daily. Start taking on: August 24, 2022   valsartan-hydrochlorothiazide 320-25 MG tablet Commonly known as:  DIOVAN-HCT TAKE 1 TABLET BY MOUTH ONCE A DAY.   vitamin B-12 100 MCG tablet Commonly known as: CYANOCOBALAMIN Take 100 mcg by mouth daily.   Vitamin D3 125 MCG (5000 UT) Caps Take by mouth.   Zinc 100 MG Tabs Take 1 tablet by mouth daily.        Follow-up Information     Kirstie Peri, MD Follow up in 1 week(s).   Specialty: Internal Medicine Contact information: [address]  [place] [address] [phone]         Jonelle Sidle, MD .   Specialty: Cardiology Contact information: [address] [place] [address] [phone]                Allergies  Allergen Reactions   Fish Oil Hives   Oxycontin [Oxycodone Hcl] Other (See Comments)    "made me crazy"    The results of significant diagnostics from this hospitalization (including imaging, microbiology, ancillary  and laboratory) are listed below for reference.    Microbiology: No results found for this or any previous visit (from the past 240 hour(s)).  Procedures/Studies: NM Myocar Multi W/Spect W/Wall Motion / EF  Result Date: 08/23/2022   Findings are consistent with no prior ischemia. The study is low risk.   No ST deviation was noted.   Left ventricular function is normal. End diastolic cavity size is normal. Septal defect mildly worse with stress in the setting of LBBB. Septal wall motion with LBBB No significant changes from prior NM SPECT   US Venous Img Lower Unilateral Left (DVT)  Result Date: 08/20/2022 CLINICAL DATA:  Left lower extremity pain and edema EXAM: LEFT LOWER EXTREMITY VENOUS DOPPLER ULTRASOUND TECHNIQUE: Gray-scale sonography with compression, as well as color and duplex ultrasound, were performed to evaluate the deep venous system(s) from the level of the common femoral vein through the popliteal and proximal calf veins. COMPARISON:  None Available. FINDINGS: VENOUS Normal compressibility of the common femoral, superficial femoral, and popliteal veins, as well as the  visualized calf veins. Visualized portions of profunda femoral vein and great saphenous vein unremarkable. No filling defects to suggest DVT on grayscale or color Doppler imaging. Doppler waveforms show normal direction of venous flow, normal respiratory plasticity and response to augmentation. Limited views of the contralateral common femoral vein are unremarkable. OTHER None. Limitations: none IMPRESSION: Negative. Electronically Signed   By: Jacqulynn Cadet M.D.   On: 08/20/2022 08:46   DG Chest Port 1 View  Result Date: 08/20/2022 CLINICAL DATA:  Chest pain EXAM: PORTABLE CHEST 1 VIEW COMPARISON:  08/11/2022 FINDINGS: Lungs are clear. No pneumothorax or pleural effusion. Cardiac size is mildly enlarged, unchanged. Pulmonary vascularity is normal. No acute bone abnormality. IMPRESSION: Stable cardiomegaly. Electronically Signed   By: Fidela Salisbury M.D.   On: 08/20/2022 03:42   ECHOCARDIOGRAM COMPLETE  Result Date: 08/12/2022    ECHOCARDIOGRAM REPORT   Patient Name:   CAMERYN SCHUM Steeves Date of Exam: 08/12/2022 Medical Rec #:  [id number]     Height:       62.0 in Accession #:    [id number]    Weight:       203.0 lb Date of Birth:  [birth year]     BSA:          1.924 m Patient Age:    12 years      BP:           147/43 mmHg Patient Gender: F             HR:           66 bpm. Exam Location:  [facility] Procedure: 2D Echo, Cardiac Doppler, Color Doppler and Intracardiac            Opacification Agent Indications:    Chest pain  History:        Patient has prior history of Echocardiogram examinations, most                 recent 12/24/2009. Signs/Symptoms:Chest Pain; Risk                 Factors:Hypertension and Diabetes.  Sonographer:    Wenda Low Referring Phys: [id number] [place]  1. Septal motion consistent with conduction delay. Left ventricular ejection fraction, by estimation, is 55 to 60%. The left ventricle has normal function. There is moderate concentric left ventricular  hypertrophy. Left ventricular diastolic parameters are indeterminate.  2. Right ventricular systolic  function is normal. The right ventricular size is normal. There is normal pulmonary artery systolic pressure.  3. Trivial mitral valve regurgitation.  4. The aortic valve is tricuspid. Aortic valve regurgitation is not visualized. Aortic valve sclerosis/calcification is present, without any evidence of aortic stenosis.  5. The inferior vena cava is normal in size with greater than 50% respiratory variability, suggesting right atrial pressure of 3 mmHg. FINDINGS  Left Ventricle: Septal motion consistent with conduction delay. Left ventricular ejection fraction, by estimation, is 55 to 60%. The left ventricle has normal function. Definity contrast agent was given IV to delineate the left ventricular endocardial borders. The left ventricular internal cavity size was normal in size. There is moderate concentric left ventricular hypertrophy. Left ventricular diastolic parameters are indeterminate. Right Ventricle: The right ventricular size is normal. Right vetricular wall thickness was not assessed. Right ventricular systolic function is normal. There is normal pulmonary artery systolic pressure. The tricuspid regurgitant velocity is 2.58 m/s, and with an assumed right atrial pressure of 3 mmHg, the estimated right ventricular systolic pressure is 29.6 mmHg. Left Atrium: Left atrial size was normal in size. Right Atrium: Right atrial size was normal in size. Pericardium: There is no evidence of pericardial effusion. Mitral Valve: There is mild thickening of the mitral valve leaflet(s). Mild mitral annular calcification. Trivial mitral valve regurgitation. MV peak gradient, 3.4 mmHg. The mean mitral valve gradient is 1.0 mmHg. Tricuspid Valve: The tricuspid valve is normal in structure. Tricuspid valve regurgitation is trivial. Aortic Valve: The aortic valve is tricuspid. Aortic valve regurgitation is not visualized.  Aortic valve sclerosis/calcification is present, without any evidence of aortic stenosis. Aortic valve mean gradient measures 9.0 mmHg. Aortic valve peak gradient measures 14.9 mmHg. Aortic valve area, by VTI measures 2.32 cm. Pulmonic Valve: The pulmonic valve was normal in structure. Pulmonic valve regurgitation is mild. Aorta: The aortic root and ascending aorta are structurally normal, with no evidence of dilitation. Venous: The inferior vena cava is normal in size with greater than 50% respiratory variability, suggesting right atrial pressure of 3 mmHg. IAS/Shunts: No atrial level shunt detected by color flow Doppler.  LEFT VENTRICLE PLAX 2D LVIDd:         4.40 cm     Diastology LVIDs:         3.10 cm     LV e' medial:    4.57 cm/s LV PW:         1.60 cm     LV E/e' medial:  12.2 LV IVS:        1.50 cm     LV e' lateral:   5.55 cm/s LVOT diam:     2.00 cm     LV E/e' lateral: 10.0 LV SV:         114 LV SV Index:   59 LVOT Area:     3.14 cm  LV Volumes (MOD) LV vol d, MOD A4C: 45.1 ml LV vol s, MOD A4C: 17.9 ml LV SV MOD A4C:     45.1 ml RIGHT VENTRICLE RV Basal diam:  3.60 cm RV Mid diam:    2.90 cm RV S prime:     9.14 cm/s TAPSE (M-mode): 2.2 cm LEFT ATRIUM             Index        RIGHT ATRIUM           Index LA diam:        4.40 cm 2.29 cm/m  RA Area:     19.40 cm LA Vol (A2C):   58.1 ml 30.20 ml/m  RA Volume:   55.70 ml  28.95 ml/m LA Vol (A4C):   56.4 ml 29.31 ml/m LA Biplane Vol: 59.8 ml 31.08 ml/m  AORTIC VALVE                     PULMONIC VALVE AV Area (Vmax):    2.46 cm      PV Vmax:       1.30 m/s AV Area (Vmean):   2.33 cm      PV Peak grad:  6.8 mmHg AV Area (VTI):     2.32 cm AV Vmax:           193.00 cm/s AV Vmean:          140.000 cm/s AV VTI:            0.491 m AV Peak Grad:      14.9 mmHg AV Mean Grad:      9.0 mmHg LVOT Vmax:         151.00 cm/s LVOT Vmean:        104.000 cm/s LVOT VTI:          0.362 m LVOT/AV VTI ratio: 0.74  AORTA Ao Root diam: 3.40 cm Ao Asc diam:  3.30 cm  MITRAL VALVE                TRICUSPID VALVE MV Area (PHT): 3.40 cm     TR Peak grad:   26.6 mmHg MV Area VTI:   4.39 cm     TR Vmax:        258.00 cm/s MV Peak grad:  3.4 mmHg MV Mean grad:  1.0 mmHg     SHUNTS MV Vmax:       0.92 m/s     Systemic VTI:  0.36 m MV Vmean:      54.3 cm/s    Systemic Diam: 2.00 cm MV Decel Time: 223 msec MV E velocity: 55.70 cm/s MV A velocity: 104.00 cm/s MV E/A ratio:  0.54 Dietrich PatesPaula Ross MD Electronically signed by Dietrich PatesPaula Ross MD Signature Date/Time: 08/12/2022/4:01:09 PM    Final    DG Chest Port 1 View  Result Date: 08/11/2022 CLINICAL DATA:  Chest pain.  Chest heaviness, shortness of breath. EXAM: PORTABLE CHEST 1 VIEW COMPARISON:  Radiograph 11/10/2020 FINDINGS: Mild cardiomegaly. Stable mediastinal contours with aortic atherosclerosis. No focal airspace disease, pulmonary edema, pleural effusion or pneumothorax. Chronic left shoulder arthropathy. IMPRESSION: Unchanged mild cardiomegaly. No acute chest findings. Electronically Signed   By: Narda RutherfordMelanie  Sanford M.D.   On: 08/11/2022 15:07    Labs: BNP (last 3 results) No results for input(s): "BNP" in the last 8760 hours. Basic Metabolic Panel: Recent Labs  Lab 08/20/22 0320 08/20/22 0520 08/21/22 0440 08/22/22 0500 08/23/22 0706  NA 139  --  137 138 139  K 4.1  --  4.1 4.0 4.2  CL 104  --  106 105 108  CO2 26  --  27 26 24   GLUCOSE 152*  --  127* 125* 123*  BUN 28*  --  25* 23 27*  CREATININE 1.32*  --  1.22* 1.17* 1.11*  CALCIUM 9.5  --  9.0 8.9 9.3  MG  --  2.2  --  2.2 2.2   Liver Function Tests: Recent Labs  Lab 08/20/22 0320  AST 22  ALT 18  ALKPHOS 71  BILITOT 0.5  PROT  6.8  ALBUMIN 3.9   No results for input(s): "LIPASE", "AMYLASE" in the last 168 hours. No results for input(s): "AMMONIA" in the last 168 hours. CBC: Recent Labs  Lab 08/20/22 0320 08/21/22 0440  WBC 7.7 5.7  NEUTROABS 5.5  --   HGB 10.8* 10.1*  HCT 33.4* 30.8*  MCV 91.5 92.5  PLT 217 194   Cardiac Enzymes: No  results for input(s): "CKTOTAL", "CKMB", "CKMBINDEX", "TROPONINI" in the last 168 hours. BNP: Invalid input(s): "POCBNP" CBG: No results for input(s): "GLUCAP" in the last 168 hours. D-Dimer No results for input(s): "DDIMER" in the last 72 hours. Hgb A1c No results for input(s): "HGBA1C" in the last 72 hours. Lipid Profile No results for input(s): "CHOL", "HDL", "LDLCALC", "TRIG", "CHOLHDL", "LDLDIRECT" in the last 72 hours. Thyroid function studies No results for input(s): "TSH", "T4TOTAL", "T3FREE", "THYROIDAB" in the last 72 hours.  Invalid input(s): "FREET3" Anemia work up No results for input(s): "VITAMINB12", "FOLATE", "FERRITIN", "TIBC", "IRON", "RETICCTPCT" in the last 72 hours. Urinalysis No results found for: "COLORURINE", "APPEARANCEUR", "LABSPEC", "PHURINE", "GLUCOSEU", "HGBUR", "BILIRUBINUR", "KETONESUR", "PROTEINUR", "UROBILINOGEN", "NITRITE", "LEUKOCYTESUR" Sepsis Labs Recent Labs  Lab 08/20/22 0320 08/21/22 0440  WBC 7.7 5.7   Microbiology No results found for this or any previous visit (from the past 240 hour(s)).   Time coordinating discharge: 25 minutes  SIGNED: Lanae Boast, MD  [facility] 08/23/2022, 1:47 PM  If 7PM-7AM, please contact night-coverage [email or web address]

## 2022-08-23 NOTE — Plan of Care (Signed)
NM stress findings c/w LBBB. Low concern for significant ischemia. Recommend to continue current medications per my note. She can be discharged from a cardiac standpoint.

## 2022-08-23 NOTE — Plan of Care (Signed)
Pt alert and oriented x 4. Pt has denied chest pain. Vitals stable. No prns given. NPO at this time for NM stress test this am. Pt educated and aware. Problem: Education: Goal: Knowledge of General Education information will improve Description: Including pain rating scale, medication(s)/side effects and non-pharmacologic comfort measures Outcome: Progressing   Problem: Health Behavior/Discharge Planning: Goal: Ability to manage health-related needs will improve Outcome: Progressing   Problem: Clinical Measurements: Goal: Ability to maintain clinical measurements within normal limits will improve Outcome: Progressing Goal: Will remain free from infection Outcome: Progressing Goal: Diagnostic test results will improve Outcome: Progressing Goal: Respiratory complications will improve Outcome: Progressing Goal: Cardiovascular complication will be avoided Outcome: Progressing   Problem: Activity: Goal: Risk for activity intolerance will decrease Outcome: Progressing   Problem: Nutrition: Goal: Adequate nutrition will be maintained Outcome: Progressing   Problem: Coping: Goal: Level of anxiety will decrease Outcome: Progressing   Problem: Elimination: Goal: Will not experience complications related to bowel motility Outcome: Progressing Goal: Will not experience complications related to urinary retention Outcome: Progressing   Problem: Pain Managment: Goal: General experience of comfort will improve Outcome: Progressing   Problem: Safety: Goal: Ability to remain free from injury will improve Outcome: Progressing   Problem: Skin Integrity: Goal: Risk for impaired skin integrity will decrease Outcome: Progressing

## 2022-08-23 NOTE — Plan of Care (Signed)

## 2022-08-23 NOTE — Hospital Course (Addendum)
88 year old female with CAD with PCI in 2014 hypertension hypothyroidism, GERD, type 2 diabetes mellitus diet controlled admitted with chest pain recurrent, improved with nitroglycerin, EKG no ST or T wave changes chest x-ray no acute finding.  Cardiology consulted with plan for a stress test 08/23/2022. Underwent nuclear scan stress test which was low risk and cardiology advised to discharge home.

## 2022-08-30 NOTE — Progress Notes (Unsigned)
Cardiology Office Note  Date: 08/30/2022   ID: Maria Durham, DOB 06-26-1929, MRN [id number]  PCP:  Maria Blitz, MD  Cardiologist:  Maria Lesches, MD Electrophysiologist:  None   No chief complaint on file.   History of Present Illness: Maria Durham is a 88 y.o. female last seen in December 2022.  She presents for a posthospital follow-up.  She was hospitalized recently with chest discomfort, high-sensitivity troponin I levels did not suggest ACS.  She was seen in consultation by cardiology and underwent a Lexiscan Myoview showing no clear evidence of ischemia and normal LVEF.  Past Medical History:  Diagnosis Date   Coronary artery disease    DES to proximal RCA March 2014 [facility]   Essential hypertension    Obesity    BMI 35.4   Type 2 diabetes mellitus ([place])     Past Surgical History:  Procedure Laterality Date   CARDIAC CATHETERIZATION  2014   with stent placement    CHOLECYSTECTOMY      Current Outpatient Medications  Medication Sig Dispense Refill   acetaminophen (TYLENOL) 325 MG tablet Take 2 tablets (650 mg total) by mouth every 6 (six) hours as needed for mild pain (or Fever >/= 101). 20 tablet 0   amLODipine (NORVASC) 10 MG tablet Take 1 tablet (10 mg total) by mouth daily. 30 tablet 0   ascorbic acid (VITAMIN C) 500 MG tablet Take 500 mg by mouth daily.     aspirin 81 MG tablet Take 81 mg by mouth daily.     Cholecalciferol (VITAMIN D3) 125 MCG (5000 UT) CAPS Take by mouth.     isosorbide mononitrate (IMDUR) 30 MG 24 hr tablet Take 1 tablet (30 mg total) by mouth daily. 30 tablet 0   levothyroxine (SYNTHROID) 75 MCG tablet Take 75 mcg by mouth daily before breakfast.     Multiple Vitamins-Minerals (MULTIVITAMIN PO) Take by mouth.     Multiple Vitamins-Minerals (PRESERVISION AREDS 2+MULTI VIT PO) Take 1 tablet by mouth daily.     nitroGLYCERIN (NITROSTAT) 0.4 MG SL tablet Place 1 tablet (0.4 mg total) under the tongue every 5 (five) minutes x 3 doses as  needed (if no relief after 2nd dose, proceed to ED for evaluation). Reported on 01/07/2016 25 tablet 3   ondansetron (ZOFRAN) 4 MG tablet Take 1 tablet (4 mg total) by mouth every 6 (six) hours as needed for nausea. 20 tablet 0   pantoprazole (PROTONIX) 40 MG tablet Take 40 mg by mouth daily.     polyethylene glycol (MIRALAX / GLYCOLAX) 17 g packet Take 17 g by mouth daily as needed for mild constipation. 14 each 0   rosuvastatin (CRESTOR) 10 MG tablet Take 1 tablet (10 mg total) by mouth daily. 90 tablet 3   spironolactone (ALDACTONE) 25 MG tablet Take 1 tablet (25 mg total) by mouth daily. 30 tablet 0   valsartan-hydrochlorothiazide (DIOVAN-HCT) 320-25 MG tablet TAKE 1 TABLET BY MOUTH ONCE A DAY. 60 tablet 6   vitamin B-12 (CYANOCOBALAMIN) 100 MCG tablet Take 100 mcg by mouth daily.     vitamin C (ASCORBIC ACID) 500 MG tablet Take 500 mg by mouth daily.     Zinc 100 MG TABS Take 1 tablet by mouth daily.     No current facility-administered medications for this visit.   Allergies:  Fish oil and Oxycontin [oxycodone hcl]   Social History: The patient  reports that she has never smoked. She has never used smokeless tobacco. She  reports that she does not drink alcohol and does not use drugs.   Family History: The patient's family history includes Coronary artery disease in an other family member; Diabetes in her mother.   ROS:  Please see the history of present illness. Otherwise, complete review of systems is positive for {NONE DEFAULTED:18576}.  All other systems are reviewed and negative.   Physical Exam: VS:  There were no vitals taken for this visit., BMI There is no height or weight on file to calculate BMI.  Wt Readings from Last 3 Encounters:  08/20/22 201 lb 1 oz (91.2 kg)  08/11/22 203 lb 0.7 oz (92.1 kg)  11/11/21 203 lb (92.1 kg)    General: Patient appears comfortable at rest. HEENT: Conjunctiva and lids normal, oropharynx clear with moist mucosa. Neck: Supple, no elevated  JVP or carotid bruits, no thyromegaly. Lungs: Clear to auscultation, nonlabored breathing at rest. Cardiac: Regular rate and rhythm, no S3 or significant systolic murmur, no pericardial rub. Abdomen: Soft, nontender, no hepatomegaly, bowel sounds present, no guarding or rebound. Extremities: No pitting edema, distal pulses 2+. Skin: Warm and dry. Musculoskeletal: No kyphosis. Neuropsychiatric: Alert and oriented x3, affect grossly appropriate.  ECG:  An ECG dated 08/20/2022 was personally reviewed today and demonstrated:  Sinus rhythm with prolonged PR interval and left bundle branch block.  Recent Labwork: 08/13/2022: TSH 1.683 08/20/2022: ALT 18; AST 22 08/21/2022: Hemoglobin 10.1; Platelets 194 08/23/2022: BUN 27; Creatinine, Ser 1.11; Magnesium 2.2; Potassium 4.2; Sodium 139     Component Value Date/Time   CHOL 129 08/13/2022 0248   CHOL 168 01/12/2022 1011   TRIG 151 (H) 08/13/2022 0248   HDL 39 (L) 08/13/2022 0248   HDL 54 01/12/2022 1011   CHOLHDL 3.3 08/13/2022 0248   VLDL 30 08/13/2022 0248   LDLCALC 60 08/13/2022 0248   LDLCALC 89 01/12/2022 1011    Other Studies Reviewed Today:  Echocardiogram 08/12/2022:  1. Septal motion consistent with conduction delay. Left ventricular  ejection fraction, by estimation, is 55 to 60%. The left ventricle has  normal function. There is moderate concentric left ventricular  hypertrophy. Left ventricular diastolic parameters  are indeterminate.   2. Right ventricular systolic function is normal. The right ventricular  size is normal. There is normal pulmonary artery systolic pressure.   3. Trivial mitral valve regurgitation.   4. The aortic valve is tricuspid. Aortic valve regurgitation is not  visualized. Aortic valve sclerosis/calcification is present, without any  evidence of aortic stenosis.   5. The inferior vena cava is normal in size with greater than 50%  respiratory variability, suggesting right atrial pressure of 3 mmHg.    Lexiscan Myoview 08/23/2022:   Findings are consistent with no prior ischemia. The study is low risk.   No ST deviation was noted.   Left ventricular function is normal. End diastolic cavity size is normal.   Septal defect mildly worse with stress in the setting of LBBB. Septal wall motion with LBBB No significant changes from prior NM SPECT  Assessment and Plan:    Medication Adjustments/Labs and Tests Ordered: Current medicines are reviewed at length with the patient today.  Concerns regarding medicines are outlined above.   Tests Ordered: No orders of the defined types were placed in this encounter.   Medication Changes: No orders of the defined types were placed in this encounter.   Disposition:  Follow up {follow up:15908}  Signed, Jonelle Sidle, MD, [facility] 08/30/2022 10:28 AM    [facility] at [place], [place], [place] [address] Phone: [phone]; Fax: [phone]

## 2022-08-31 ENCOUNTER — Ambulatory Visit: Payer: Medicare Other | Attending: Cardiology | Admitting: Cardiology

## 2022-08-31 ENCOUNTER — Encounter: Payer: Self-pay | Admitting: Cardiology

## 2022-08-31 VITALS — BP 118/66 | HR 64 | Ht 63.0 in | Wt 204.6 lb

## 2022-08-31 DIAGNOSIS — E782 Mixed hyperlipidemia: Secondary | ICD-10-CM | POA: Diagnosis not present

## 2022-08-31 DIAGNOSIS — I447 Left bundle-branch block, unspecified: Secondary | ICD-10-CM | POA: Diagnosis not present

## 2022-08-31 DIAGNOSIS — I10 Essential (primary) hypertension: Secondary | ICD-10-CM

## 2022-08-31 DIAGNOSIS — I25119 Atherosclerotic heart disease of native coronary artery with unspecified angina pectoris: Secondary | ICD-10-CM

## 2022-08-31 NOTE — Patient Instructions (Addendum)

## 2022-09-13 ENCOUNTER — Encounter (INDEPENDENT_AMBULATORY_CARE_PROVIDER_SITE_OTHER): Payer: Medicare Other | Admitting: Ophthalmology

## 2022-09-13 ENCOUNTER — Encounter (INDEPENDENT_AMBULATORY_CARE_PROVIDER_SITE_OTHER): Payer: Self-pay

## 2022-10-21 ENCOUNTER — Ambulatory Visit: Payer: Medicare Other | Admitting: Nurse Practitioner

## 2023-01-31 ENCOUNTER — Other Ambulatory Visit: Payer: Self-pay | Admitting: Cardiology

## 2023-03-21 ENCOUNTER — Encounter: Payer: Self-pay | Admitting: Cardiology

## 2023-03-21 ENCOUNTER — Ambulatory Visit: Payer: Medicare Other | Attending: Cardiology | Admitting: Cardiology

## 2023-03-21 VITALS — BP 102/66 | HR 71 | Ht 63.0 in | Wt 202.4 lb

## 2023-03-21 DIAGNOSIS — E782 Mixed hyperlipidemia: Secondary | ICD-10-CM | POA: Diagnosis not present

## 2023-03-21 DIAGNOSIS — I25119 Atherosclerotic heart disease of native coronary artery with unspecified angina pectoris: Secondary | ICD-10-CM

## 2023-03-21 NOTE — Patient Instructions (Addendum)

## 2023-03-21 NOTE — Progress Notes (Signed)
    Cardiology Office Note  Date: 03/21/2023   ID: Maria Durham, DOB 15-Jul-1929, MRN [id number]  History of Present Illness: Maria Durham is a 89 y.o. female last seen in October 2023.  She is here today with family member for a follow-up visit.  She reports no angina on medical therapy.  Stable NYHA class II dyspnea with basic activities.  No palpitations or syncope.  I reviewed her medications which are stable from a cardiac perspective.  She anticipates lab work with Dr. Sherryll Burger in May.  Reports no major change in health otherwise.  She was seen at [facility] with chicken bone stuck in cervical esophagus requiring extraction.  I reviewed her lab work from that encounter.  Physical Exam: VS:  BP 102/66   Pulse 71   Ht 5\' 3"  (1.6 m)   Wt 202 lb 6.4 oz (91.8 kg)   SpO2 96%   BMI 35.85 kg/m , BMI Body mass index is 35.85 kg/m.  Wt Readings from Last 3 Encounters:  03/21/23 202 lb 6.4 oz (91.8 kg)  08/31/22 204 lb 9.6 oz (92.8 kg)  08/20/22 201 lb 1 oz (91.2 kg)    General: Patient appears comfortable at rest. HEENT: Conjunctiva and lids normal. Neck: Supple, no elevated JVP or carotid bruits. Lungs: Clear to auscultation, nonlabored breathing at rest. Cardiac: Regular rate and rhythm, no S3, 1/6 systolic murmur. Extremities: No pitting edema.  ECG:  An ECG dated 08/20/2022 was personally reviewed today and demonstrated:  Sinus rhythm with prolonged PR interval and left bundle branch block.  Labwork: 08/13/2022: TSH 1.683 08/20/2022: ALT 18; AST 22 08/21/2022: Hemoglobin 10.1; Platelets 194 08/23/2022: BUN 27; Creatinine, Ser 1.11; Magnesium 2.2; Potassium 4.2; Sodium 139     Component Value Date/Time   CHOL 129 08/13/2022 0248   CHOL 168 01/12/2022 1011   TRIG 151 (H) 08/13/2022 0248   HDL 39 (L) 08/13/2022 0248   HDL 54 01/12/2022 1011   CHOLHDL 3.3 08/13/2022 0248   VLDL 30 08/13/2022 0248   LDLCALC 60 08/13/2022 0248   LDLCALC 89 01/12/2022 1011  March 2024: Pro-BNP  993, high-sensitivity troponin I 8, hemoglobin 10.6, platelets 202, potassium 4.1, BUN 25, creatinine 1.26  Other Studies Reviewed Today:  Lexiscan Myoview 08/23/2022:   Findings are consistent with no prior ischemia. The study is low risk.   No ST deviation was noted.   Left ventricular function is normal. End diastolic cavity size is normal.   Septal defect mildly worse with stress in the setting of LBBB. Septal wall motion with LBBB No significant changes from prior NM SPECT  Assessment and Plan:  1.  CAD status post DES to the proximal RCA in March 2014 at [facility].  LVEF 55 to 60% by echocardiogram in September 2023.  Lexiscan Myoview in October 2023 showed no ischemia.  Continue medical therapy in the absence of increasing angina.  She is on aspirin, Norvasc, Imdur, Crestor, and as needed nitroglycerin.  2.  Essential hypertension.  Blood pressure low normal today, she is asymptomatic on current regimen.  No changes were made.  3.  Mixed hyperlipidemia.  Continue Crestor and follow-up lab work with PCP as scheduled in May.  Disposition:  Follow up  6 months.  Signed, Jonelle Sidle, M.D., F.A.C.C. [facility] at [facility]

## 2023-05-03 ENCOUNTER — Other Ambulatory Visit: Payer: Self-pay | Admitting: Cardiology

## 2023-09-07 ENCOUNTER — Encounter: Payer: Self-pay | Admitting: Cardiology

## 2023-09-07 ENCOUNTER — Ambulatory Visit: Payer: Medicare Other | Attending: Cardiology | Admitting: Cardiology

## 2023-09-07 VITALS — BP 134/64 | HR 71 | Ht 63.0 in | Wt 194.6 lb

## 2023-09-07 DIAGNOSIS — I10 Essential (primary) hypertension: Secondary | ICD-10-CM | POA: Diagnosis not present

## 2023-09-07 DIAGNOSIS — E782 Mixed hyperlipidemia: Secondary | ICD-10-CM

## 2023-09-07 DIAGNOSIS — I25119 Atherosclerotic heart disease of native coronary artery with unspecified angina pectoris: Secondary | ICD-10-CM

## 2023-09-07 NOTE — Patient Instructions (Addendum)

## 2023-09-07 NOTE — Progress Notes (Signed)
Cardiology Office Note  Date: 09/07/2023   ID: TAIYAH GINTHER, DOB [birth year], MRN [id number]  History of Present Illness: Maria Durham is a 89 y.o. female last seen in April.  She is here today with family member for a follow-up visit.  Reports no angina or nitroglycerin use.  Stable NYHA class II dyspnea.  No palpitations or syncope.  I reviewed her medications today.  Current cardiac regimen includes aspirin, Norvasc, Crestor, Imdur, Aldactone, Diovan HCTZ, and as needed nitroglycerin.  ECG today shows sinus rhythm with prolonged PR interval and left bundle branch block.  She had lab work in June indicating LDL 61.  Physical Exam: VS:  BP 134/64 (BP Location: Right Arm)   Pulse 71   Ht 5\' 3"  (1.6 m)   Wt 194 lb 9.6 oz (88.3 kg)   SpO2 96%   BMI 34.47 kg/m , BMI Body mass index is 34.47 kg/m.  Wt Readings from Last 3 Encounters:  09/07/23 194 lb 9.6 oz (88.3 kg)  03/21/23 202 lb 6.4 oz (91.8 kg)  08/31/22 204 lb 9.6 oz (92.8 kg)    General: Patient appears comfortable at rest. HEENT: Conjunctiva and lids normal. Neck: Supple, no elevated JVP or carotid bruits. Lungs: Clear to auscultation, nonlabored breathing at rest. Cardiac: Regular rate and rhythm, no S3, 1/6 systolic murmur. Extremities: No pitting edema, distal pulses 2+.  ECG:  An ECG dated 08/20/2022 was personally reviewed today and demonstrated:  Sinus rhythm with prolonged PR interval and left bundle branch block.  Labwork:    Component Value Date/Time   CHOL 129 08/13/2022 0248   CHOL 168 01/12/2022 1011   TRIG 151 (H) 08/13/2022 0248   HDL 39 (L) 08/13/2022 0248   HDL 54 01/12/2022 1011   CHOLHDL 3.3 08/13/2022 0248   VLDL 30 08/13/2022 0248   LDLCALC 60 08/13/2022 0248   LDLCALC 89 01/12/2022 1011  June 2024: Hemoglobin 10.7, platelets 228, BUN 31, creatinine 1.42, potassium 4.8, AST 18, ALT 9, cholesterol 135, triglycerides 146, HDL 49, LDL 61, TSH 0.889  Other Studies Reviewed  Today:  Lexiscan Myoview 08/23/2022:   Findings are consistent with no prior ischemia. The study is low risk.   No ST deviation was noted.   Left ventricular function is normal. End diastolic cavity size is normal.   Septal defect mildly worse with stress in the setting of LBBB. Septal wall motion with LBBB No significant changes from prior NM SPECT  Assessment and Plan:  1.  CAD status post DES to the proximal RCA in March 2014 at [facility].  LVEF 55 to 60% by echocardiogram in September 2023.  Lexiscan Myoview in October 2023 showed no ischemia.  She does not describe any angina at this time, no interval nitroglycerin use.  Continue aspirin, Imdur, and Crestor.   2.  Primary hypertension.  Blood pressure is reasonable today, no changes were made.  She is on Norvasc, Aldactone, and Diovan HCTZ.   3.  Mixed hyperlipidemia.  LDL 61 in June.  Continue Crestor.  Disposition:  Follow up  6 months.  Signed, Jonelle Sidle, M.D., F.A.C.C. [facility] at [facility]

## 2023-10-31 ENCOUNTER — Other Ambulatory Visit: Payer: Self-pay | Admitting: *Deleted

## 2023-10-31 MED ORDER — VALSARTAN-HYDROCHLOROTHIAZIDE 320-25 MG PO TABS: 1.0000 | ORAL_TABLET | Freq: Every day | ORAL | 1 refills | Status: DC

## 2023-11-08 ENCOUNTER — Other Ambulatory Visit: Payer: Self-pay | Admitting: *Deleted

## 2023-11-08 MED ORDER — ROSUVASTATIN CALCIUM 10 MG PO TABS: 10.0000 mg | ORAL_TABLET | Freq: Every day | ORAL | 2 refills | Status: DC

## 2024-04-13 ENCOUNTER — Other Ambulatory Visit: Payer: Self-pay | Admitting: Cardiology

## 2024-06-08 ENCOUNTER — Encounter: Payer: Self-pay | Admitting: Cardiology

## 2024-06-08 ENCOUNTER — Ambulatory Visit: Attending: Cardiology | Admitting: Cardiology

## 2024-06-08 VITALS — BP 130/64 | HR 84 | Ht 63.0 in | Wt 194.6 lb

## 2024-06-08 DIAGNOSIS — N184 Chronic kidney disease, stage 4 (severe): Secondary | ICD-10-CM

## 2024-06-08 DIAGNOSIS — I25119 Atherosclerotic heart disease of native coronary artery with unspecified angina pectoris: Secondary | ICD-10-CM

## 2024-06-08 DIAGNOSIS — E782 Mixed hyperlipidemia: Secondary | ICD-10-CM

## 2024-06-08 DIAGNOSIS — I10 Essential (primary) hypertension: Secondary | ICD-10-CM

## 2024-06-08 MED ORDER — SPIRONOLACTONE 25 MG PO TABS: 12.5000 mg | ORAL_TABLET | Freq: Every day | ORAL | 6 refills | Status: AC

## 2024-06-08 NOTE — Patient Instructions (Addendum)
 Medication Instructions:  Your physician has recommended you make the following change in your medication:  Decrease spironolactone  to 12.5 mg daily. Continue all other medications as prescribed.  Labwork: none  Testing/Procedures: none  Follow-Up: Your physician recommends that you schedule a follow-up appointment in: 1 year. You will receive a reminder call in about 8-10 months reminding you to schedule your appointment. If you don't receive this call, please contact our office.  Any Other Special Instructions Will Be Listed Below (If Applicable).  If you need a refill on your cardiac medications before your next appointment, please call your pharmacy.

## 2024-06-08 NOTE — Progress Notes (Signed)
    Cardiology Office Note  Date: 06/08/2024   ID: Maria Durham, DOB [birth year], MRN [id number]  History of Present Illness: Maria Durham is a 89 y.o. female last seen in June 2024.  She is here today with family member for a follow-up visit.  She does not report any exertional chest pain or unusual shortness of breath with low-level activity.  No palpitations or syncope.  We went over her medications.  She continues to follow with Dr. Maree.  I reviewed her recent lab work.  We discussed reducing dose of Aldactone .  Lipids look well-controlled with LDL 55.  Blood pressure today was reasonable as well.  She does not report any nitroglycerin  use.  Physical Exam: VS:  BP 130/64 (BP Location: Right Arm)   Pulse 84   Ht 5' 3 (1.6 m)   Wt 194 lb 9.6 oz (88.3 kg)   SpO2 97%   BMI 34.47 kg/m , BMI Body mass index is 34.47 kg/m.  Wt Readings from Last 3 Encounters:  06/08/24 194 lb 9.6 oz (88.3 kg)  09/07/23 194 lb 9.6 oz (88.3 kg)  03/21/23 202 lb 6.4 oz (91.8 kg)    General: Patient appears comfortable at rest. HEENT: Conjunctiva and lids normal. Neck: Supple, no elevated JVP or carotid bruits. Lungs: Clear to auscultation, nonlabored breathing at rest. Cardiac: Regular rate and rhythm, no S3, 1/6 systolic murmur, no pericardial rub. Extremities: No pitting edema.  ECG:  An ECG dated 09/07/2023 was personally reviewed today and demonstrated:  Sinus rhythm with prolonged PR interval and left bundle branch block.  Labwork:  June 2025: Hemoglobin 10, platelets 207, BUN 46, creatinine 1.77, GFR 26, potassium 5.2, AST 17, ALT 8, cholesterol 114, triglycerides 108, HDL 39, LDL 55, TSH 1.44  Other Studies Reviewed Today:  No interval cardiac testing for review today.  Assessment and Plan:  1.  CAD status post DES to the proximal RCA in March 2014 at [facility].  LVEF 55 to 60% by echocardiogram in September 2023.  Lexiscan  Myoview  in October 2023 showed no ischemia.  She does  not report any angina or interval nitroglycerin  use.  Continue aspirin  81 mg daily, Imdur  30 mg daily and Crestor  10 mg daily.   2.  Primary hypertension.  Blood pressure control reasonable today.  She is on Norvasc  10 mg daily, Diovan  HCT 320/25 mg daily, and Aldactone  which will be cut back to 12.5 mg daily.   3.  Mixed hyperlipidemia.  LDL 55 in June.  Continue Crestor  10 mg daily.  4.  CKD stage IV, creatinine 1.77 with GFR 26 in June.  Reducing Aldactone  to 12.5 mg daily.  Disposition:  Follow up 1 year.  Signed, Jayson JUDITHANN Sierras, M.D., F.A.C.C. [facility] at [facility]

## 2024-07-16 ENCOUNTER — Emergency Department (HOSPITAL_COMMUNITY)
Admission: EM | Admit: 2024-07-16 | Discharge: 2024-07-16 | Disposition: A | Discharge status: Home / Self Care | Attending: Emergency Medicine | Admitting: Emergency Medicine

## 2024-07-16 ENCOUNTER — Encounter (HOSPITAL_COMMUNITY): Payer: Self-pay | Admitting: *Deleted

## 2024-07-16 ENCOUNTER — Emergency Department (HOSPITAL_COMMUNITY)

## 2024-07-16 ENCOUNTER — Other Ambulatory Visit: Payer: Self-pay

## 2024-07-16 DIAGNOSIS — I10 Essential (primary) hypertension: Secondary | ICD-10-CM | POA: Insufficient documentation

## 2024-07-16 DIAGNOSIS — Z79899 Other long term (current) drug therapy: Secondary | ICD-10-CM | POA: Diagnosis not present

## 2024-07-16 DIAGNOSIS — R1012 Left upper quadrant pain: Secondary | ICD-10-CM | POA: Diagnosis not present

## 2024-07-16 DIAGNOSIS — Z7982 Long term (current) use of aspirin: Secondary | ICD-10-CM | POA: Diagnosis not present

## 2024-07-16 DIAGNOSIS — R1032 Left lower quadrant pain: Secondary | ICD-10-CM | POA: Diagnosis present

## 2024-07-16 DIAGNOSIS — R1030 Lower abdominal pain, unspecified: Secondary | ICD-10-CM

## 2024-07-16 LAB — COMPREHENSIVE METABOLIC PANEL WITH GFR
ALT: 13 U/L (ref 0–44)
AST: 18 U/L (ref 15–41)
Albumin: 3.7 g/dL (ref 3.5–5.0)
Alkaline Phosphatase: 57 U/L (ref 38–126)
Anion gap: 8 (ref 5–15)
BUN: 28 mg/dL — ABNORMAL HIGH (ref 8–23)
CO2: 25 mmol/L (ref 22–32)
Calcium: 9.5 mg/dL (ref 8.9–10.3)
Chloride: 105 mmol/L (ref 98–111)
Creatinine, Ser: 1.26 mg/dL — ABNORMAL HIGH (ref 0.44–1.00)
GFR, Estimated: 40 mL/min — ABNORMAL LOW (ref >60)
Glucose, Bld: 113 mg/dL — ABNORMAL HIGH (ref 70–99)
Potassium: 4.9 mmol/L (ref 3.5–5.1)
Sodium: 138 mmol/L (ref 135–145)
Total Bilirubin: 0.6 mg/dL (ref 0.0–1.2)
Total Protein: 6.5 g/dL (ref 6.5–8.1)

## 2024-07-16 LAB — LIPASE, BLOOD: Lipase: 36 U/L (ref 11–51)

## 2024-07-16 LAB — CBC
HCT: 33 % — ABNORMAL LOW (ref 36.0–46.0)
Hemoglobin: 10.9 g/dL — ABNORMAL LOW (ref 12.0–15.0)
MCH: 31.1 pg (ref 26.0–34.0)
MCHC: 33 g/dL (ref 30.0–36.0)
MCV: 94 fL (ref 80.0–100.0)
Platelets: 221 K/uL (ref 150–400)
RBC: 3.51 MIL/uL — ABNORMAL LOW (ref 3.87–5.11)
RDW: 12.4 % (ref 11.5–15.5)
WBC: 7 K/uL (ref 4.0–10.5)
nRBC: 0 % (ref 0.0–0.2)

## 2024-07-16 LAB — URINALYSIS, ROUTINE W REFLEX MICROSCOPIC
Bilirubin Urine: NEGATIVE
Glucose, UA: NEGATIVE mg/dL
Hgb urine dipstick: NEGATIVE
Ketones, ur: NEGATIVE mg/dL
Nitrite: NEGATIVE
Protein, ur: NEGATIVE mg/dL
Specific Gravity, Urine: 1.009 (ref 1.005–1.030)
pH: 6 (ref 5.0–8.0)

## 2024-07-16 MED ORDER — FENTANYL CITRATE (PF) 100 MCG/2ML IJ SOLN
50.0000 ug | Freq: Once | INTRAMUSCULAR | Status: AC
  Administered 2024-07-16: 50 ug via INTRAVENOUS
  Filled 2024-07-16: qty 2

## 2024-07-16 MED ORDER — SODIUM CHLORIDE 0.9 % IV BOLUS
500.0000 mL | Freq: Once | INTRAVENOUS | Status: AC
  Administered 2024-07-16: 500 mL via INTRAVENOUS

## 2024-07-16 MED ORDER — IOHEXOL 300 MG/ML  SOLN
80.0000 mL | Freq: Once | INTRAMUSCULAR | Status: AC | PRN
  Administered 2024-07-16: 80 mL via INTRAVENOUS

## 2024-07-16 NOTE — ED Notes (Signed)
 Patient transported to CT via stretcher in NAD.

## 2024-07-16 NOTE — Discharge Instructions (Addendum)
 You were seen in the emergency department for lower abdominal pain.  You had blood work urinalysis and a CAT scan that did not show an obvious explanation for your symptoms.  You can use Tylenol  for pain and should increase your fluid (water) intake and fiber intake.  Follow-up with your regular doctor.  Return to the emergency department if any worsening or concerning symptoms

## 2024-07-16 NOTE — ED Triage Notes (Signed)
 Pt with left side abd pain that started today. Denies any N/V/D.  LBM today

## 2024-07-16 NOTE — ED Notes (Signed)
 Pt assisted to the bathroom via wheelchair in NAD. Voided without difficulty. Assisted back to bed.

## 2024-07-16 NOTE — ED Provider Notes (Signed)
 [facility] AT [facility] Provider Note   CSN: [id number] Arrival date & time: 07/16/24  1731     Patient presents with: Abdominal Pain   Maria Durham is a 89 y.o. female.  She has a history of hypertension.  Brought in by family for evaluation of left-sided abdominal pain that started at 430 this morning.  Rates it as 5 out of 10.  Denies prior history of same.  Having 2 normal bowel movements today, no urinary symptoms, no fever nausea vomiting.  Prior history of cholecystectomy and hysterectomy.  {Add pertinent medical, surgical, social history, OB history to YEP:67052} The history is provided by the patient.  Abdominal Pain Pain location:  LUQ and LLQ Pain quality: aching   Pain radiates to:  Does not radiate Pain severity:  Moderate Onset quality:  Sudden Duration:  12 hours Timing:  Constant Progression:  Unchanged Chronicity:  New Context: not trauma   Relieved by:  None tried Worsened by:  Nothing Ineffective treatments:  None tried Associated symptoms: no chest pain, no constipation, no cough, no diarrhea, no dysuria, no fever, no nausea and no vomiting        Prior to Admission medications   Medication Sig Start Date End Date Taking? Authorizing Provider  acetaminophen  (TYLENOL ) 325 MG tablet Take 2 tablets (650 mg total) by mouth every 6 (six) hours as needed for mild pain (or Fever >/= 101). 08/14/22   Sheikh, Omair Latif, DO  amLODipine  (NORVASC ) 10 MG tablet Take 1 tablet (10 mg total) by mouth daily. 08/15/22   Sheikh, Omair Latif, DO  ascorbic acid (VITAMIN C) 500 MG tablet Take 500 mg by mouth daily.    [provider]  aspirin  81 MG tablet Take 81 mg by mouth daily.    [provider]  isosorbide  mononitrate (IMDUR ) 30 MG 24 hr tablet Take 1 tablet (30 mg total) by mouth daily. 08/15/22   Sheikh, Omair Latif, DO  levothyroxine  (SYNTHROID ) 75 MCG tablet Take 75 mcg by mouth daily before breakfast.    [provider]  Multiple Vitamins-Minerals (MULTIVITAMIN PO) Take by mouth.    [provider]  Multiple Vitamins-Minerals (PRESERVISION AREDS 2+MULTI VIT PO) Take 1 tablet by mouth daily.    [provider]  nitroGLYCERIN  (NITROSTAT ) 0.4 MG SL tablet Place 1 tablet (0.4 mg total) under the tongue every 5 (five) minutes x 3 doses as needed (if no relief after 2nd dose, proceed to ED for evaluation). Reported on 01/07/2016 11/11/21   Debera Jayson MATSU, MD  pantoprazole  (PROTONIX ) 40 MG tablet Take 40 mg by mouth daily.    [provider]  polyethylene glycol (MIRALAX  / GLYCOLAX ) 17 g packet Take 17 g by mouth daily as needed for mild constipation. 08/14/22   Sherrill Cable Latif, DO  rosuvastatin  (CRESTOR ) 10 MG tablet Take 1 tablet (10 mg total) by mouth daily. 11/08/23   Debera Jayson MATSU, MD  spironolactone  (ALDACTONE ) 25 MG tablet Take 0.5 tablets (12.5 mg total) by mouth daily. 06/08/24   Debera Jayson MATSU, MD  valsartan -hydrochlorothiazide  (DIOVAN -HCT) 320-25 MG tablet Take 1 tablet by mouth daily. 04/13/24   Debera Jayson MATSU, MD  vitamin B-12 (CYANOCOBALAMIN) 100 MCG tablet Take 100 mcg by mouth daily.    [provider]  Zinc 100 MG TABS Take 1 tablet by mouth daily.    [provider]    Allergies: Fish oil and Oxycontin  [oxycodone  hcl]    Review of Systems  Constitutional:  Negative for fever.  Respiratory:  Negative for cough.   Cardiovascular:  Negative for chest pain.  Gastrointestinal:  Positive for abdominal pain. Negative for constipation, diarrhea, nausea and vomiting.  Genitourinary:  Negative for dysuria.    Updated Vital Signs BP (!) 131/54 (BP Location: Right Arm)   Pulse 65   Temp 97.7 F (36.5 C) (Oral)   Resp 20   Ht 5' 3 (1.6 m)   Wt 87.5 kg   SpO2 99%   BMI 34.19 kg/m   Physical Exam Vitals and nursing note reviewed.  Constitutional:      General: She is not in acute distress.    Appearance: Normal  appearance. She is well-developed.  HENT:     Head: Normocephalic and atraumatic.  Eyes:     Conjunctiva/sclera: Conjunctivae normal.  Cardiovascular:     Rate and Rhythm: Normal rate and regular rhythm.     Heart sounds: No murmur heard. Pulmonary:     Effort: Pulmonary effort is normal. No respiratory distress.     Breath sounds: Normal breath sounds. No stridor. No wheezing.  Abdominal:     Palpations: Abdomen is soft.     Tenderness: There is abdominal tenderness (LLQ). There is no guarding or rebound.  Musculoskeletal:        General: No deformity.     Cervical back: Neck supple.  Skin:    General: Skin is warm and dry.  Neurological:     General: No focal deficit present.     Mental Status: She is alert.     GCS: GCS eye subscore is 4. GCS verbal subscore is 5. GCS motor subscore is 6.     (all labs ordered are listed, but only abnormal results are displayed) Labs Reviewed  LIPASE, BLOOD  COMPREHENSIVE METABOLIC PANEL WITH GFR  CBC  URINALYSIS, ROUTINE W REFLEX MICROSCOPIC    EKG: None  Radiology: No results found.  {Document cardiac monitor, telemetry assessment procedure when appropriate:32947} Procedures   Medications Ordered in the ED  sodium chloride  0.9 % bolus 500 mL (has no administration in time range)  fentaNYL  (SUBLIMAZE ) injection 50 mcg (has no administration in time range)      {Click here for ABCD2, HEART and other calculators REFRESH Note before signing:1}                              Medical Decision Making Amount and/or Complexity of Data Reviewed Labs: ordered.  Risk Prescription drug management.   This patient complains of ***; this involves an extensive number of treatment Options and is a complaint that carries with it a high risk of complications and morbidity. The differential includes ***  I ordered, reviewed and interpreted labs, which included *** I ordered medication *** and reviewed PMP when indicated. I ordered  imaging studies which included *** and I independently    visualized and interpreted imaging which showed *** Additional history obtained from *** Previous records obtained and reviewed *** I consulted *** and discussed lab and imaging findings and discussed disposition.  Cardiac monitoring reviewed, *** Social determinants considered, *** Critical Interventions: ***  After the interventions stated above, I reevaluated the patient and found *** Admission and further testing considered, ***   {Document critical care time when appropriate  Document review of labs and clinical decision tools ie CHADS2VASC2, etc  Document your independent review of radiology images and any outside records  Document your discussion  with family members, caretakers and with consultants  Document social determinants of health affecting pt's care  Document your decision making why or why not admission, treatments were needed:32947:::1}   Final diagnoses:  None    ED Discharge Orders     None

## 2024-07-26 ENCOUNTER — Other Ambulatory Visit: Payer: Self-pay | Admitting: Cardiology

## 2024-08-14 ENCOUNTER — Telehealth: Payer: Self-pay | Admitting: Cardiology

## 2024-08-14 MED ORDER — ROSUVASTATIN CALCIUM 10 MG PO TABS: 10.0000 mg | ORAL_TABLET | Freq: Every day | ORAL | 3 refills | Status: AC

## 2024-08-14 NOTE — Telephone Encounter (Signed)
*  STAT* If patient is at the pharmacy, call can be transferred to refill team.   1. Which medications need to be refilled? (please list name of each medication and dose if known)   rosuvastatin  (CRESTOR ) 10 MG tablet    2. Which pharmacy/location (including street and city if local pharmacy) is medication to be sent to?  [facility] - [place], [place] - [address]    3. Do they need a 30 day or 90 day supply? 90

## 2024-08-14 NOTE — Telephone Encounter (Signed)
 Pt's medication was sent to pt's pharmacy as requested. Confirmation received.

## 2024-10-12 ENCOUNTER — Other Ambulatory Visit: Payer: Self-pay | Admitting: Cardiology

## 2024-10-22 ENCOUNTER — Telehealth: Payer: Self-pay | Admitting: Cardiology

## 2024-10-22 NOTE — Telephone Encounter (Signed)
*  STAT* If patient is at the pharmacy, call can be transferred to refill team.   1. Which medications need to be refilled? (please list name of each medication and dose if known)   valsartan -hydrochlorothiazide  (DIOVAN -HCT) 320-25 MG tablet    2. Which pharmacy/location (including street and city if local pharmacy) is medication to be sent to?  [facility] - [place], [place] - [address]      3. Do they need a 30 day or 90 day supply? 90 day    Pt is out of medication
